# Patient Record
Sex: Female | Born: 1980 | Hispanic: Yes | Marital: Married | Smoking: Never smoker
Health system: Southern US, Community
[De-identification: ages and names within clinical notes are randomized; demographics above are authoritative.]

## PROBLEM LIST (undated history)

## (undated) ENCOUNTER — Emergency Department (HOSPITAL_COMMUNITY): Payer: Self-pay

## (undated) DIAGNOSIS — K819 Cholecystitis, unspecified: Secondary | ICD-10-CM

---

## 2017-07-05 ENCOUNTER — Emergency Department (HOSPITAL_COMMUNITY): Payer: Self-pay | Admitting: Anesthesiology

## 2017-07-05 ENCOUNTER — Emergency Department (HOSPITAL_COMMUNITY): Payer: Self-pay

## 2017-07-05 ENCOUNTER — Observation Stay (HOSPITAL_COMMUNITY)
Admission: EM | Admit: 2017-07-05 | Discharge: 2017-07-06 | Disposition: A | Discharge status: Home / Self Care | Payer: Self-pay | Attending: General Surgery | Admitting: General Surgery

## 2017-07-05 ENCOUNTER — Encounter (HOSPITAL_COMMUNITY): Payer: Self-pay | Admitting: Emergency Medicine

## 2017-07-05 ENCOUNTER — Encounter (HOSPITAL_COMMUNITY)
Admission: EM | Disposition: A | Discharge status: Home / Self Care | Payer: Self-pay | Source: Home / Self Care | Attending: Emergency Medicine

## 2017-07-05 DIAGNOSIS — Z9049 Acquired absence of other specified parts of digestive tract: Secondary | ICD-10-CM

## 2017-07-05 DIAGNOSIS — K420 Umbilical hernia with obstruction, without gangrene: Secondary | ICD-10-CM | POA: Insufficient documentation

## 2017-07-05 DIAGNOSIS — K8000 Calculus of gallbladder with acute cholecystitis without obstruction: Secondary | ICD-10-CM

## 2017-07-05 DIAGNOSIS — K819 Cholecystitis, unspecified: Secondary | ICD-10-CM | POA: Diagnosis present

## 2017-07-05 DIAGNOSIS — K8010 Calculus of gallbladder with chronic cholecystitis without obstruction: Principal | ICD-10-CM | POA: Insufficient documentation

## 2017-07-05 DIAGNOSIS — R1011 Right upper quadrant pain: Secondary | ICD-10-CM

## 2017-07-05 DIAGNOSIS — K760 Fatty (change of) liver, not elsewhere classified: Secondary | ICD-10-CM | POA: Insufficient documentation

## 2017-07-05 HISTORY — DX: Cholecystitis, unspecified: K81.9

## 2017-07-05 HISTORY — PX: UMBILICAL HERNIA REPAIR: SHX196

## 2017-07-05 HISTORY — PX: CHOLECYSTECTOMY: SHX55

## 2017-07-05 LAB — COMPREHENSIVE METABOLIC PANEL
ALBUMIN: 3.7 g/dL (ref 3.5–5.0)
ALK PHOS: 116 U/L (ref 38–126)
ALT: 23 U/L (ref 14–54)
AST: 24 U/L (ref 15–41)
Anion gap: 9 (ref 5–15)
BILIRUBIN TOTAL: 0.3 mg/dL (ref 0.3–1.2)
BUN: 16 mg/dL (ref 6–20)
CALCIUM: 9.5 mg/dL (ref 8.9–10.3)
CO2: 30 mmol/L (ref 22–32)
Chloride: 101 mmol/L (ref 101–111)
Creatinine, Ser: 0.64 mg/dL (ref 0.44–1.00)
GFR calc Af Amer: >60 mL/min (ref >60)
GFR calc non Af Amer: >60 mL/min (ref >60)
GLUCOSE: 117 mg/dL — AB (ref 65–99)
POTASSIUM: 3.8 mmol/L (ref 3.5–5.1)
SODIUM: 140 mmol/L (ref 135–145)
TOTAL PROTEIN: 7.1 g/dL (ref 6.5–8.1)

## 2017-07-05 LAB — CBC
HEMATOCRIT: 40.2 % (ref 36.0–46.0)
Hemoglobin: 12.9 g/dL (ref 12.0–15.0)
MCH: 26.1 pg (ref 26.0–34.0)
MCHC: 32.1 g/dL (ref 30.0–36.0)
MCV: 81.4 fL (ref 78.0–100.0)
Platelets: 429 10*3/uL — ABNORMAL HIGH (ref 150–400)
RBC: 4.94 MIL/uL (ref 3.87–5.11)
RDW: 14.1 % (ref 11.5–15.5)
WBC: 13.7 10*3/uL — ABNORMAL HIGH (ref 4.0–10.5)

## 2017-07-05 LAB — URINALYSIS, ROUTINE W REFLEX MICROSCOPIC
BILIRUBIN URINE: NEGATIVE
GLUCOSE, UA: NEGATIVE mg/dL
HGB URINE DIPSTICK: NEGATIVE
KETONES UR: NEGATIVE mg/dL
Leukocytes, UA: NEGATIVE
NITRITE: NEGATIVE
PH: 7 (ref 5.0–8.0)
Protein, ur: NEGATIVE mg/dL
SPECIFIC GRAVITY, URINE: 1.025 (ref 1.005–1.030)

## 2017-07-05 LAB — I-STAT BETA HCG BLOOD, ED (MC, WL, AP ONLY): I-stat hCG, quantitative: <5 m[IU]/mL (ref <5)

## 2017-07-05 LAB — LIPASE, BLOOD: Lipase: 44 U/L (ref 11–51)

## 2017-07-05 SURGERY — LAPAROSCOPIC CHOLECYSTECTOMY
Anesthesia: General | Site: Abdomen

## 2017-07-05 MED ORDER — LIDOCAINE HCL (CARDIAC) 20 MG/ML IV SOLN
INTRAVENOUS | Status: DC | PRN
  Administered 2017-07-05: 60 mg via INTRAVENOUS

## 2017-07-05 MED ORDER — SODIUM CHLORIDE 0.9 % IV SOLN
INTRAVENOUS | Status: DC
  Administered 2017-07-05: 17:00:00 via INTRAVENOUS

## 2017-07-05 MED ORDER — ONDANSETRON HCL 4 MG/2ML IJ SOLN
4.0000 mg | Freq: Four times a day (QID) | INTRAMUSCULAR | Status: DC | PRN
  Administered 2017-07-05 – 2017-07-06 (×2): 4 mg via INTRAVENOUS
  Filled 2017-07-05 (×2): qty 2

## 2017-07-05 MED ORDER — BUPIVACAINE-EPINEPHRINE 0.25% -1:200000 IJ SOLN
INTRAMUSCULAR | Status: DC | PRN
  Administered 2017-07-05: 1 mL

## 2017-07-05 MED ORDER — ROCURONIUM BROMIDE 10 MG/ML (PF) SYRINGE
PREFILLED_SYRINGE | INTRAVENOUS | Status: AC
  Filled 2017-07-05: qty 10

## 2017-07-05 MED ORDER — MORPHINE SULFATE (PF) 4 MG/ML IV SOLN
2.0000 mg | INTRAVENOUS | Status: DC | PRN
  Filled 2017-07-05: qty 1

## 2017-07-05 MED ORDER — GELATIN ABSORBABLE 12-7 MM EX MISC
CUTANEOUS | Status: DC | PRN
  Administered 2017-07-05: 1

## 2017-07-05 MED ORDER — FENTANYL CITRATE (PF) 100 MCG/2ML IJ SOLN
INTRAMUSCULAR | Status: DC | PRN
  Administered 2017-07-05: 50 ug via INTRAVENOUS
  Administered 2017-07-05: 150 ug via INTRAVENOUS
  Administered 2017-07-05: 50 ug via INTRAVENOUS

## 2017-07-05 MED ORDER — BUPIVACAINE-EPINEPHRINE (PF) 0.25% -1:200000 IJ SOLN
INTRAMUSCULAR | Status: AC
  Filled 2017-07-05: qty 30

## 2017-07-05 MED ORDER — OXYCODONE HCL 5 MG PO TABS
5.0000 mg | ORAL_TABLET | ORAL | Status: DC | PRN
  Administered 2017-07-05: 10 mg via ORAL
  Administered 2017-07-06: 5 mg via ORAL
  Filled 2017-07-05: qty 1
  Filled 2017-07-05: qty 2

## 2017-07-05 MED ORDER — IBUPROFEN 400 MG PO TABS
400.0000 mg | ORAL_TABLET | Freq: Once | ORAL | Status: AC | PRN
  Administered 2017-07-05: 400 mg via ORAL

## 2017-07-05 MED ORDER — LACTATED RINGERS IV SOLN
INTRAVENOUS | Status: DC
  Administered 2017-07-05: 14:00:00 via INTRAVENOUS

## 2017-07-05 MED ORDER — PHENYLEPHRINE 40 MCG/ML (10ML) SYRINGE FOR IV PUSH (FOR BLOOD PRESSURE SUPPORT)
PREFILLED_SYRINGE | INTRAVENOUS | Status: AC
  Filled 2017-07-05: qty 20

## 2017-07-05 MED ORDER — FENTANYL CITRATE (PF) 250 MCG/5ML IJ SOLN
INTRAMUSCULAR | Status: AC
  Filled 2017-07-05: qty 5

## 2017-07-05 MED ORDER — ONDANSETRON HCL 4 MG/2ML IJ SOLN
INTRAMUSCULAR | Status: DC | PRN
  Administered 2017-07-05: 4 mg via INTRAVENOUS

## 2017-07-05 MED ORDER — ONDANSETRON 4 MG PO TBDP: 4.0000 mg | ORAL_TABLET | Freq: Four times a day (QID) | ORAL | Status: DC | PRN

## 2017-07-05 MED ORDER — ROCURONIUM BROMIDE 100 MG/10ML IV SOLN
INTRAVENOUS | Status: DC | PRN
  Administered 2017-07-05: 30 mg via INTRAVENOUS
  Administered 2017-07-05: 10 mg via INTRAVENOUS

## 2017-07-05 MED ORDER — DEXAMETHASONE SODIUM PHOSPHATE 10 MG/ML IJ SOLN
INTRAMUSCULAR | Status: DC | PRN
  Administered 2017-07-05: 5 mg via INTRAVENOUS

## 2017-07-05 MED ORDER — SODIUM CHLORIDE 0.9 % IV BOLUS (SEPSIS)
1000.0000 mL | Freq: Once | INTRAVENOUS | Status: AC
  Administered 2017-07-05: 1000 mL via INTRAVENOUS

## 2017-07-05 MED ORDER — SIMETHICONE 80 MG PO CHEW: 40.0000 mg | CHEWABLE_TABLET | Freq: Four times a day (QID) | ORAL | Status: DC | PRN

## 2017-07-05 MED ORDER — IBUPROFEN 400 MG PO TABS
ORAL_TABLET | ORAL | Status: AC
Start: 2017-07-05 — End: 2017-07-05
  Filled 2017-07-05: qty 1

## 2017-07-05 MED ORDER — DEXTROSE 5 % IV SOLN
2.0000 g | INTRAVENOUS | Status: DC
  Filled 2017-07-05: qty 2

## 2017-07-05 MED ORDER — ENOXAPARIN SODIUM 40 MG/0.4ML ~~LOC~~ SOLN
40.0000 mg | SUBCUTANEOUS | Status: DC
  Administered 2017-07-06: 40 mg via SUBCUTANEOUS
  Filled 2017-07-05: qty 0.4

## 2017-07-05 MED ORDER — PROPOFOL 10 MG/ML IV BOLUS
INTRAVENOUS | Status: DC | PRN
  Administered 2017-07-05: 20 mg via INTRAVENOUS
  Administered 2017-07-05: 140 mg via INTRAVENOUS

## 2017-07-05 MED ORDER — DEXAMETHASONE SODIUM PHOSPHATE 10 MG/ML IJ SOLN
INTRAMUSCULAR | Status: AC
  Filled 2017-07-05: qty 3

## 2017-07-05 MED ORDER — ONDANSETRON 4 MG PO TBDP
4.0000 mg | ORAL_TABLET | Freq: Once | ORAL | Status: AC | PRN
  Administered 2017-07-05: 4 mg via ORAL

## 2017-07-05 MED ORDER — GI COCKTAIL ~~LOC~~
30.0000 mL | Freq: Once | ORAL | Status: AC
  Administered 2017-07-05: 30 mL via ORAL
  Filled 2017-07-05: qty 30

## 2017-07-05 MED ORDER — KETOROLAC TROMETHAMINE 15 MG/ML IJ SOLN
15.0000 mg | Freq: Four times a day (QID) | INTRAMUSCULAR | Status: DC
  Administered 2017-07-05 – 2017-07-06 (×3): 15 mg via INTRAVENOUS
  Filled 2017-07-05 (×3): qty 1

## 2017-07-05 MED ORDER — MORPHINE SULFATE (PF) 4 MG/ML IV SOLN
4.0000 mg | Freq: Once | INTRAVENOUS | Status: AC
Start: 2017-07-05 — End: 2017-07-05
  Administered 2017-07-05: 4 mg via INTRAVENOUS
  Filled 2017-07-05: qty 1

## 2017-07-05 MED ORDER — ETOMIDATE 2 MG/ML IV SOLN
INTRAVENOUS | Status: AC
  Filled 2017-07-05: qty 10

## 2017-07-05 MED ORDER — LIDOCAINE 2% (20 MG/ML) 5 ML SYRINGE
INTRAMUSCULAR | Status: AC
  Filled 2017-07-05: qty 10

## 2017-07-05 MED ORDER — ONDANSETRON HCL 4 MG/2ML IJ SOLN
4.0000 mg | Freq: Once | INTRAMUSCULAR | Status: AC
  Administered 2017-07-05: 4 mg via INTRAVENOUS
  Filled 2017-07-05: qty 2

## 2017-07-05 MED ORDER — HYDROMORPHONE HCL 1 MG/ML IJ SOLN: 0.2500 mg | INTRAMUSCULAR | Status: DC | PRN

## 2017-07-05 MED ORDER — MIDAZOLAM HCL 5 MG/5ML IJ SOLN
INTRAMUSCULAR | Status: DC | PRN
  Administered 2017-07-05: 2 mg via INTRAVENOUS

## 2017-07-05 MED ORDER — SODIUM CHLORIDE 0.9 % IR SOLN
Status: DC | PRN
  Administered 2017-07-05: 1000 mL

## 2017-07-05 MED ORDER — SUCCINYLCHOLINE CHLORIDE 200 MG/10ML IV SOSY
PREFILLED_SYRINGE | INTRAVENOUS | Status: AC
  Filled 2017-07-05: qty 20

## 2017-07-05 MED ORDER — DEXTROSE 5 % IV SOLN
2.0000 g | INTRAVENOUS | Status: DC
  Administered 2017-07-05: 2 g via INTRAVENOUS
  Filled 2017-07-05: qty 2

## 2017-07-05 MED ORDER — SUGAMMADEX SODIUM 200 MG/2ML IV SOLN
INTRAVENOUS | Status: DC | PRN
  Administered 2017-07-05: 100 mg via INTRAVENOUS

## 2017-07-05 MED ORDER — ONDANSETRON HCL 4 MG/2ML IJ SOLN
INTRAMUSCULAR | Status: AC
  Filled 2017-07-05: qty 4

## 2017-07-05 MED ORDER — ACETAMINOPHEN 500 MG PO TABS
1000.0000 mg | ORAL_TABLET | Freq: Four times a day (QID) | ORAL | Status: DC
  Administered 2017-07-06 (×2): 1000 mg via ORAL
  Filled 2017-07-05 (×3): qty 2

## 2017-07-05 MED ORDER — 0.9 % SODIUM CHLORIDE (POUR BTL) OPTIME
TOPICAL | Status: DC | PRN
  Administered 2017-07-05: 1000 mL

## 2017-07-05 MED ORDER — ONDANSETRON 4 MG PO TBDP
ORAL_TABLET | ORAL | Status: AC
Start: 2017-07-05 — End: 2017-07-05
  Filled 2017-07-05: qty 1

## 2017-07-05 MED ORDER — SUCCINYLCHOLINE CHLORIDE 20 MG/ML IJ SOLN
INTRAMUSCULAR | Status: DC | PRN
  Administered 2017-07-05: 100 mg via INTRAVENOUS

## 2017-07-05 MED ORDER — MIDAZOLAM HCL 2 MG/2ML IJ SOLN
INTRAMUSCULAR | Status: AC
  Filled 2017-07-05: qty 2

## 2017-07-05 SURGICAL SUPPLY — 39 items
APPLIER CLIP 5 13 M/L LIGAMAX5 (MISCELLANEOUS) ×3
BLADE CLIPPER SURG (BLADE) IMPLANT
CANISTER SUCT 3000ML PPV (MISCELLANEOUS) ×3 IMPLANT
CHLORAPREP W/TINT 26ML (MISCELLANEOUS) ×3 IMPLANT
CLIP APPLIE 5 13 M/L LIGAMAX5 (MISCELLANEOUS) ×1 IMPLANT
CLOSURE WOUND 1/2 X4 (GAUZE/BANDAGES/DRESSINGS) ×1
COVER SURGICAL LIGHT HANDLE (MISCELLANEOUS) ×3 IMPLANT
DERMABOND ADVANCED (GAUZE/BANDAGES/DRESSINGS) ×2
DERMABOND ADVANCED .7 DNX12 (GAUZE/BANDAGES/DRESSINGS) ×1 IMPLANT
DEVICE TROCAR PUNCTURE CLOSURE (ENDOMECHANICALS) ×3 IMPLANT
ELECT REM PT RETURN 9FT ADLT (ELECTROSURGICAL) ×3
ELECTRODE REM PT RTRN 9FT ADLT (ELECTROSURGICAL) ×1 IMPLANT
GLOVE BIO SURGEON STRL SZ7 (GLOVE) ×3 IMPLANT
GLOVE BIOGEL PI IND STRL 7.5 (GLOVE) ×1 IMPLANT
GLOVE BIOGEL PI INDICATOR 7.5 (GLOVE) ×2
GOWN STRL REUS W/ TWL LRG LVL3 (GOWN DISPOSABLE) ×3 IMPLANT
GOWN STRL REUS W/TWL LRG LVL3 (GOWN DISPOSABLE) ×6
HEMOSTAT SNOW SURGICEL 2X4 (HEMOSTASIS) ×3 IMPLANT
KIT BASIN OR (CUSTOM PROCEDURE TRAY) ×3 IMPLANT
KIT ROOM TURNOVER OR (KITS) ×3 IMPLANT
NS IRRIG 1000ML POUR BTL (IV SOLUTION) ×3 IMPLANT
PAD ARMBOARD 7.5X6 YLW CONV (MISCELLANEOUS) ×3 IMPLANT
POUCH RETRIEVAL ECOSAC 10 (ENDOMECHANICALS) ×1 IMPLANT
POUCH RETRIEVAL ECOSAC 10MM (ENDOMECHANICALS) ×2
SCISSORS LAP 5X35 DISP (ENDOMECHANICALS) ×3 IMPLANT
SET IRRIG TUBING LAPAROSCOPIC (IRRIGATION / IRRIGATOR) ×3 IMPLANT
SLEEVE ENDOPATH XCEL 5M (ENDOMECHANICALS) ×6 IMPLANT
SPECIMEN JAR SMALL (MISCELLANEOUS) ×3 IMPLANT
STRIP CLOSURE SKIN 1/2X4 (GAUZE/BANDAGES/DRESSINGS) ×2 IMPLANT
SUT MNCRL AB 4-0 PS2 18 (SUTURE) ×3 IMPLANT
SUT VIC AB 3-0 SH 27 (SUTURE) ×2
SUT VIC AB 3-0 SH 27XBRD (SUTURE) ×1 IMPLANT
SUT VICRYL 0 UR6 27IN ABS (SUTURE) ×3 IMPLANT
TOWEL OR 17X24 6PK STRL BLUE (TOWEL DISPOSABLE) ×3 IMPLANT
TOWEL OR 17X26 10 PK STRL BLUE (TOWEL DISPOSABLE) ×3 IMPLANT
TRAY LAPAROSCOPIC MC (CUSTOM PROCEDURE TRAY) ×3 IMPLANT
TROCAR XCEL BLUNT TIP 100MML (ENDOMECHANICALS) ×3 IMPLANT
TROCAR XCEL NON-BLD 5MMX100MML (ENDOMECHANICALS) ×3 IMPLANT
TUBING INSUFFLATION (TUBING) ×3 IMPLANT

## 2017-07-05 NOTE — Anesthesia Preprocedure Evaluation (Addendum)
Anesthesia Evaluation  Patient identified by MRN, date of birth, ID band Patient awake    Reviewed: Allergy & Precautions, H&P , NPO status , Patient's Chart, lab work & pertinent test results  Airway Mallampati: III  TM Distance: >3 FB Neck ROM: Full    Dental no notable dental hx. (+) Teeth Intact, Dental Advisory Given   Pulmonary neg pulmonary ROS,    Pulmonary exam normal breath sounds clear to auscultation       Cardiovascular negative cardio ROS   Rhythm:Regular Rate:Normal     Neuro/Psych negative neurological ROS  negative psych ROS   GI/Hepatic negative GI ROS, Neg liver ROS,   Endo/Other  negative endocrine ROS  Renal/GU negative Renal ROS  negative genitourinary   Musculoskeletal   Abdominal   Peds  Hematology negative hematology ROS (+)   Anesthesia Other Findings   Reproductive/Obstetrics negative OB ROS                            Anesthesia Physical Anesthesia Plan  ASA: II  Anesthesia Plan: General   Post-op Pain Management:    Induction: Intravenous, Rapid sequence and Cricoid pressure planned  PONV Risk Score and Plan: 4 or greater and Ondansetron, Dexamethasone and Midazolam  Airway Management Planned: Oral ETT  Additional Equipment:   Intra-op Plan:   Post-operative Plan: Extubation in OR  Informed Consent: I have reviewed the patients History and Physical, chart, labs and discussed the procedure including the risks, benefits and alternatives for the proposed anesthesia with the patient or authorized representative who has indicated his/her understanding and acceptance.   Dental advisory given  Plan Discussed with: CRNA and Surgeon  Anesthesia Plan Comments:        Anesthesia Quick Evaluation

## 2017-07-05 NOTE — ED Notes (Signed)
ED Provider at bedside. 

## 2017-07-05 NOTE — Op Note (Signed)
Preoperative diagnosis: acutecholecystitis Postoperative diagnosis:acutecholecystitis Procedure: Laparoscopic cholecystectomy, primary umbilical hernia repair Surgeon: Dr. Harden Mo Anesthesia: Gen. Specimens: gb to pathology Estimated blood loss: minimal Complications: None Drains: none Sponge count was correct at completion Disposition to recovery stable  Indications: This is a 45 yof with symptoms consistent with cholecystitis. We discussed lap chole with risks/benefits.  Procedure: After informed consent was obtained the patient was taken to the operating room. She was givenantibiotics. SCDs were in place. She was placed undergeneral anesthesia without complication. Her abdomen was prepped and draped in the standard sterile surgical fashion. A surgical timeout was then performed.  I infiltrated marcaine below the umbilicus. I made a vertical incision. I lifted the umbilicus off the fascia and identified a 3 mm defect but had a lot of preperitoneal fat incarcerated in it.  I was able to enter the sac and excise it in its entirey.  I incised the fascia and entered the peritoneum through the enlarged hernai. I placed a 0 vicryl pursestring suture and inserted a hasson trocar. I insufflated the abdomen to 15 mm Hg pressure. I then placed a 5 mm trocar in theepigastrium.Two 5 mm trocars were placed in the right side of the abdomen. I grasped the gallbladder and retracted cephalad and lateral.  EventuallyI was able to identify the critical view of safety. I then clipped the duct and divided it. The duct was viable. The clips traversed the duct. I then clipped and divided the cystic artery. I then removed the gallbladder from the liver bed. I placed it in a bag and removed from the umbilicus .I obtained hemostasis.I did place a piece of surgicel snow in the gallbladder bed. I then removed the 45mm trocar and closed the umbilical hernia where I had the trocar with multiple2-0  vicryls using the endoclose device.This completely obliterated the defect. I then removed the remaining trocars and these were closed with 4-0 Monocryl and glue. She tolerated this well be transferred to the recovery room.

## 2017-07-05 NOTE — Transfer of Care (Signed)
Immediate Anesthesia Transfer of Care Note  Patient: Kaitlyn Brooks  Procedure(s) Performed: Procedure(s): LAPAROSCOPIC CHOLECYSTECTOMY (N/A) UMBILICAL HERNIA REPAIR (N/A)  Patient Location: PACU  Anesthesia Type:General  Level of Consciousness: drowsy  Airway & Oxygen Therapy: Patient Spontanous Breathing and Patient connected to face mask oxygen  Post-op Assessment: Report given to RN, Post -op Vital signs reviewed and stable and Patient moving all extremities  Post vital signs: Reviewed and stable  Last Vitals:  Vitals:   07/05/17 1300 07/05/17 1553  BP:  109/61  Pulse: 76 79  Resp:  13  Temp:  (!) 36.3 C  SpO2: 97% 96%    Last Pain:  Vitals:   07/05/17 1553  TempSrc:   PainSc: (P) Asleep         Complications: No apparent anesthesia complications

## 2017-07-05 NOTE — Progress Notes (Signed)
Patient arrived to 6n04, alert and oriented , minimal pain to abdomen, VSS, IV fluids infusing, SCD's on, 4 lap sites noted to mid abdomen with steri-strips all clean dry and intact. Oriented to room and staff, family in room, will continue to monitor.

## 2017-07-05 NOTE — ED Triage Notes (Signed)
Pt c/o 9/10 epigastric pain since last night with nausea and vomiting no fever or chills.

## 2017-07-05 NOTE — H&P (Signed)
[facility] Surgery Consult/Admission Note  Kaitlyn Brooks 23-Feb-1981  [id number].    Requesting MD: Dr. Tyrone Nine Chief Complaint/Reason for Consult: cholecystits  HPI:   Patient is a 36 year old Spanish-speaking female, otherwise healthy, history of cesarean section, who presented to the ED with complaint of abdominal pain since yesterday. Family member at bedside acting as interpreter. Patient states she's had intermittent abdominal pain for "a while" with associated vomiting. She was seen in 2016 for similar symptoms but surgery was not discussed at that time. Patient states the pain is in the epigastric/RUQ quadrant that radiates into her back, severe, constant, pain medication given in ED has helped. Associated nausea, vomiting, chills, and dizziness without LOC. No other associated symptoms. Patient denies diarrhea. Last normal bowel movement was yesterday. Patient denies chest pain, SOB, fever. Last meal was yesterday. No anticoagulation. No other abdominal surgeries.   ED course: Labs: MVC 13.7, glucose 117 [place] abd: cholelithiasis and sludge without sonographic evidence of acute cholecystitis. Hepatic steatosis.  ROS:  Review of Systems  Constitutional: Positive for chills. Negative for diaphoresis and fever.  Respiratory: Negative for cough and shortness of breath.   Cardiovascular: Negative for chest pain.  Gastrointestinal: Positive for abdominal pain, nausea and vomiting. Negative for constipation and diarrhea.  Neurological: Positive for dizziness. Negative for loss of consciousness and weakness.  All other systems reviewed and are negative.    History reviewed. No pertinent family history.  Past Medical History:  Diagnosis Date  . Cholecystitis     History reviewed. No pertinent surgical history.  Social History:  reports that she has never smoked. She has never used smokeless tobacco. She reports that she does not drink alcohol or use drugs.  Allergies: No  Known Allergies   (Not in a hospital admission)  Blood pressure (!) 107/57, pulse 71, temperature 98.1 F (36.7 C), temperature source Oral, resp. rate 20, height 5' 4"  (1.626 m), weight 200 lb (90.7 kg), last menstrual period 06/26/2017, SpO2 94 %.  Physical Exam  Constitutional: She is oriented to person, place, and time and well-developed, well-nourished, and in no distress. Vital signs are normal. No distress.  HENT:  Head: Normocephalic and atraumatic.  Nose: Nose normal.  Mouth/Throat: Oropharynx is clear and moist. No oropharyngeal exudate.  Eyes: Pupils are equal, round, and reactive to light. Conjunctivae are normal. Right eye exhibits no discharge. Left eye exhibits no discharge. No scleral icterus.  Neck: Normal range of motion. Neck supple. No tracheal deviation present. No thyromegaly present.  Cardiovascular: Normal rate, regular rhythm, normal heart sounds and intact distal pulses.  Exam reveals no gallop and no friction rub.   No murmur heard. Pulses:      Radial pulses are 2+ on the right side, and 2+ on the left side.       Dorsalis pedis pulses are 2+ on the right side, and 2+ on the left side.  Pulmonary/Chest: Effort normal and breath sounds normal. No respiratory distress. She has no decreased breath sounds. She has no wheezes. She has no rhonchi. She has no rales.  Abdominal: Soft. Normal appearance and bowel sounds are normal. She exhibits no distension and no mass. There is no hepatosplenomegaly. There is tenderness in the right upper quadrant and epigastric area. There is no rebound and no guarding. No hernia.  Musculoskeletal: Normal range of motion. She exhibits no edema, tenderness or deformity.  Lymphadenopathy:    She has no cervical adenopathy.  Neurological: She is alert and oriented to person, place, and  time. No cranial nerve deficit (grossly intact).  Skin: Skin is warm and dry. No rash noted. She is not diaphoretic.  Psychiatric: Mood and affect  normal.  Nursing note and vitals reviewed.   Results for orders placed or performed during the hospital encounter of 07/05/17 (from the past 48 hour(s))  Urinalysis, Routine w reflex microscopic     Status: Abnormal   Collection Time: 07/05/17  3:36 AM  Result Value Ref Range   Color, Urine YELLOW YELLOW   APPearance CLOUDY (A) CLEAR   Specific Gravity, Urine 1.025 1.005 - 1.030   pH 7.0 5.0 - 8.0   Glucose, UA NEGATIVE NEGATIVE mg/dL   Hgb urine dipstick NEGATIVE NEGATIVE   Bilirubin Urine NEGATIVE NEGATIVE   Ketones, ur NEGATIVE NEGATIVE mg/dL   Protein, ur NEGATIVE NEGATIVE mg/dL   Nitrite NEGATIVE NEGATIVE   Leukocytes, UA NEGATIVE NEGATIVE  Lipase, blood     Status: None   Collection Time: 07/05/17  3:51 AM  Result Value Ref Range   Lipase 44 11 - 51 U/L  Comprehensive metabolic panel     Status: Abnormal   Collection Time: 07/05/17  3:51 AM  Result Value Ref Range   Sodium 140 135 - 145 mmol/L   Potassium 3.8 3.5 - 5.1 mmol/L   Chloride 101 101 - 111 mmol/L   CO2 30 22 - 32 mmol/L   Glucose, Bld 117 (H) 65 - 99 mg/dL   BUN 16 6 - 20 mg/dL   Creatinine, Ser 0.64 0.44 - 1.00 mg/dL   Calcium 9.5 8.9 - 10.3 mg/dL   Total Protein 7.1 6.5 - 8.1 g/dL   Albumin 3.7 3.5 - 5.0 g/dL   AST 24 15 - 41 U/L   ALT 23 14 - 54 U/L   Alkaline Phosphatase 116 38 - 126 U/L   Total Bilirubin 0.3 0.3 - 1.2 mg/dL   GFR calc non Af Amer >60 >60 mL/min   GFR calc Af Amer >60 >60 mL/min    Comment: (NOTE) The eGFR has been calculated using the CKD EPI equation. This calculation has not been validated in all clinical situations. eGFR's persistently <60 mL/min signify possible Chronic Kidney Disease.    Anion gap 9 5 - 15  CBC     Status: Abnormal   Collection Time: 07/05/17  3:51 AM  Result Value Ref Range   WBC 13.7 (H) 4.0 - 10.5 K/uL   RBC 4.94 3.87 - 5.11 MIL/uL   Hemoglobin 12.9 12.0 - 15.0 g/dL   HCT 40.2 36.0 - 46.0 %   MCV 81.4 78.0 - 100.0 fL   MCH 26.1 26.0 - 34.0 pg    MCHC 32.1 30.0 - 36.0 g/dL   RDW 14.1 11.5 - 15.5 %   Platelets 429 (H) 150 - 400 K/uL  I-Stat beta hCG blood, ED     Status: None   Collection Time: 07/05/17  4:11 AM  Result Value Ref Range   I-stat hCG, quantitative <5.0 <5 mIU/mL   Comment 3            Comment:   GEST. AGE      CONC.  (mIU/mL)   <=1 WEEK        5 - 50     2 WEEKS       50 - 500     3 WEEKS       100 - 10,000     4 WEEKS     1,000 - 30,000  FEMALE AND NON-PREGNANT FEMALE:     LESS THAN 5 mIU/mL    US Abdomen Limited Ruq  Result Date: 07/05/2017 CLINICAL DATA:  Right upper quadrant abdominal pain. EXAM: ULTRASOUND ABDOMEN LIMITED RIGHT UPPER QUADRANT COMPARISON:  None. FINDINGS: Gallbladder: Multiple gallstones and sludge seen within the gallbladder. No wall thickening or pericholecystic fluid. No sonographic Murphy sign noted by sonographer. Common bile duct: Diameter: 3 mm, normal. Liver: No focal lesion identified. Increased parenchymal echogenicity. Portal vein is patent on color Doppler imaging with normal direction of blood flow towards the liver. IMPRESSION: 1. Cholelithiasis and sludge without sonographic evidence of acute cholecystitis. 2. Hepatic steatosis. Electronically Signed   By: Titus Dubin M.D.   On: 07/05/2017 11:21      Assessment/Plan  Cholecystitis - OR today for cholecystectomy - NPO  Admit to [facility] after surgery.   Kalman Drape, [facility] Surgery 07/05/2017, 1:21 PM Pager: [phone] Consults: [phone] Mon-Fri 7:00 am-4:30 pm Sat-Sun 7:00 am-11:30 am

## 2017-07-05 NOTE — ED Provider Notes (Signed)
[facility] Provider Note   CSN: [id number] Arrival date & time: 07/05/17  0214     History   Chief Complaint Chief Complaint  Patient presents with  . Abdominal Pain  . Emesis    HPI Kaitlyn Brooks is a 36 y.o. female.  50 yoF with a chief complaints of epigastric abdominal pain nausea and vomiting. The started yesterday and is gotten progressively worse since then. She is unable to keep anything down. Denies fevers or chills. Described as a sharp and shooting pain. Feels that radiates to her back. Denies prior abdominal surgery.   The history is provided by the patient and a relative. A language interpreter was used.  Abdominal Pain   This is a new problem. The current episode started yesterday. The problem occurs constantly. The problem has been gradually worsening. The pain is associated with eating. The pain is located in the RUQ. The quality of the pain is sharp and shooting. The pain is at a severity of 10/10. The pain is severe. Associated symptoms include nausea and vomiting. Pertinent negatives include fever, dysuria, headaches, arthralgias and myalgias. Nothing aggravates the symptoms. Nothing relieves the symptoms.    Past Medical History:  Diagnosis Date  . Cholecystitis   . Cholecystitis 07/05/2017    Patient Active Problem List   Diagnosis Date Noted  . Cholecystitis 07/05/2017  . S/P laparoscopic cholecystectomy 07/05/2017    Past Surgical History:  Procedure Laterality Date  . CHOLECYSTECTOMY N/A 07/05/2017   Procedure: LAPAROSCOPIC CHOLECYSTECTOMY;  Surgeon: Emelia Loron, MD;  Location: [facility] OR;  Service: General;  Laterality: N/A;  . UMBILICAL HERNIA REPAIR N/A 07/05/2017   Procedure: UMBILICAL HERNIA REPAIR;  Surgeon: Emelia Loron, MD;  Location: [facility] OR;  Service: General;  Laterality: N/A;    OB History    No data available       Home Medications    Prior to Admission medications   Medication Sig Start Date End Date  Taking? Authorizing Provider  acetaminophen (TYLENOL) 500 MG tablet Take 1,000 mg by mouth every 6 (six) hours as needed for moderate pain.   Yes [provider]  Multiple Vitamin (MULTIVITAMIN WITH MINERALS) TABS tablet Take 1 tablet by mouth daily.   Yes [provider]  oxyCODONE (OXY IR/ROXICODONE) 5 MG immediate release tablet Take 1-2 tablets (5-10 mg total) by mouth every 4 (four) hours as needed for moderate pain. 07/06/17   Claud Kelp, MD    Family History History reviewed. No pertinent family history.  Social History Social History  Substance Use Topics  . Smoking status: Never Smoker  . Smokeless tobacco: Never Used  . Alcohol use No     Allergies   Patient has no known allergies.   Review of Systems Review of Systems  Constitutional: Negative for chills and fever.  HENT: Negative for congestion and rhinorrhea.   Eyes: Negative for redness and visual disturbance.  Respiratory: Negative for shortness of breath and wheezing.   Cardiovascular: Negative for chest pain and palpitations.  Gastrointestinal: Positive for abdominal pain, nausea and vomiting.  Genitourinary: Negative for dysuria and urgency.  Musculoskeletal: Negative for arthralgias and myalgias.  Skin: Negative for pallor and wound.  Neurological: Negative for dizziness and headaches.     Physical Exam Updated Vital Signs BP 113/63 (BP Location: Right Arm)   Pulse 90   Temp 98.5 F (36.9 C) (Oral)   Resp 17   Ht 5\' 1"  (1.549 m)   Wt 90.7 kg (200 lb)  LMP 06/26/2017   SpO2 94%   BMI 37.79 kg/m   Physical Exam  Constitutional: She is oriented to person, place, and time. She appears well-developed and well-nourished. No distress.  HENT:  Head: Normocephalic and atraumatic.  Eyes: Pupils are equal, round, and reactive to light. EOM are normal.  Neck: Normal range of motion. Neck supple.  Cardiovascular: Normal rate and regular rhythm.  Exam reveals no gallop and no  friction rub.   No murmur heard. Pulmonary/Chest: Effort normal. She has no wheezes. She has no rales.  Abdominal: Soft. She exhibits no distension and no mass. There is tenderness (worse to the epigastrium and right upper quadrant. Positive Murphy sign.). There is no guarding.  Musculoskeletal: She exhibits no edema or tenderness.  Neurological: She is alert and oriented to person, place, and time.  Skin: Skin is warm and dry. She is not diaphoretic.  Psychiatric: She has a normal mood and affect. Her behavior is normal.  Nursing note and vitals reviewed.    ED Treatments / Results  Labs (all labs ordered are listed, but only abnormal results are displayed) Labs Reviewed  COMPREHENSIVE METABOLIC PANEL - Abnormal; Notable for the following:       Result Value   Glucose, Bld 117 (*)    All other components within normal limits  CBC - Abnormal; Notable for the following:    WBC 13.7 (*)    Platelets 429 (*)    All other components within normal limits  URINALYSIS, ROUTINE W REFLEX MICROSCOPIC - Abnormal; Notable for the following:    APPearance CLOUDY (*)    All other components within normal limits  LIPASE, BLOOD  I-STAT BETA HCG BLOOD, ED ([facility], [facility], [facility] ONLY)  SURGICAL PATHOLOGY    EKG  EKG Interpretation None       Radiology No results found.  Procedures Procedures (including critical care time)  Medications Ordered in ED Medications  ibuprofen (ADVIL,MOTRIN) 400 MG tablet (not administered)  ondansetron (ZOFRAN-ODT) 4 MG disintegrating tablet (not administered)  ondansetron (ZOFRAN-ODT) disintegrating tablet 4 mg (4 mg Oral Given 07/05/17 0357)  ibuprofen (ADVIL,MOTRIN) tablet 400 mg (400 mg Oral Given 07/05/17 0357)  gi cocktail (Maalox,Lidocaine,Donnatal) (30 mLs Oral Given 07/05/17 0926)  ondansetron (ZOFRAN) injection 4 mg (4 mg Intravenous Given 07/05/17 0928)  sodium chloride 0.9 % bolus 1,000 mL (1,000 mLs Intravenous New Bag/Given 07/05/17 0928)  morphine 4  MG/ML injection 4 mg (4 mg Intravenous Given 07/05/17 1017)     Initial Impression / Assessment and Plan / ED Course  I have reviewed the triage vital signs and the nursing notes.  Pertinent labs & imaging results that were available during my care of the patient were reviewed by me and considered in my medical decision making (see chart for details).     36 yo F With a chief complaint of epigastric abdominal pain. Patient does have significant right upper quadrant tenderness and a positive Murphy sign. We'll send for ultrasound. Labs with a leukocytosis, LFTs are unremarkable.  Pain is significantly improved though patient still feels that something is there. Still exquisitely tender in the right upper quadrant on my exam. Ultrasound with cholelithiasis without cholecystitis. With her still having some severe pain in the right upper quadrant and symptoms lasting much longer than 8 hours and concern that this may be cholecystitis. Will discuss with Surgery.  Will take to the OR.   The patients results and plan were reviewed and discussed.   Any x-rays performed were independently  reviewed by myself.   Differential diagnosis were considered with the presenting HPI.  Medications  ibuprofen (ADVIL,MOTRIN) 400 MG tablet (not administered)  ondansetron (ZOFRAN-ODT) 4 MG disintegrating tablet (not administered)  ondansetron (ZOFRAN-ODT) disintegrating tablet 4 mg (4 mg Oral Given 07/05/17 0357)  ibuprofen (ADVIL,MOTRIN) tablet 400 mg (400 mg Oral Given 07/05/17 0357)  gi cocktail (Maalox,Lidocaine,Donnatal) (30 mLs Oral Given 07/05/17 0926)  ondansetron (ZOFRAN) injection 4 mg (4 mg Intravenous Given 07/05/17 0928)  sodium chloride 0.9 % bolus 1,000 mL (1,000 mLs Intravenous New Bag/Given 07/05/17 0928)  morphine 4 MG/ML injection 4 mg (4 mg Intravenous Given 07/05/17 1017)    Vitals:   07/05/17 1725 07/05/17 2119 07/06/17 0227 07/06/17 0606  BP: 116/71 107/66 112/64 113/63  Pulse: 99 85 80 90   Resp: 14 17 17 17   Temp: 99 F (37.2 C) 98.6 F (37 C) 98.6 F (37 C) 98.5 F (36.9 C)  TempSrc: Oral Oral Oral Oral  SpO2: 90% 93% 94% 94%  Weight: 90.7 kg (200 lb)     Height: 5\' 1"  (1.549 m)       Final diagnoses:  RUQ abdominal pain  Calculus of gallbladder with acute cholecystitis without obstruction    Admission/ observation were discussed with the admitting physician, patient and/or family and they are comfortable with the plan.    Final Clinical Impressions(s) / ED Diagnoses   Final diagnoses:  RUQ abdominal pain  Calculus of gallbladder with acute cholecystitis without obstruction    New Prescriptions Discharge Medication List as of 07/06/2017 10:53 AM    START taking these medications   Details  oxyCODONE (OXY IR/ROXICODONE) 5 MG immediate release tablet Take 1-2 tablets (5-10 mg total) by mouth every 4 (four) hours as needed for moderate pain., Starting Sat 07/06/2017, Print         Speers, Jesusita Oka, DO 07/08/17 [phone]

## 2017-07-05 NOTE — Anesthesia Procedure Notes (Signed)
Procedure Name: Intubation Date/Time: 07/05/2017 2:24 PM Performed by: Melina Copa, Mariangela Heldt R Pre-anesthesia Checklist: Patient identified, Emergency Drugs available, Suction available and Patient being monitored Patient Re-evaluated:Patient Re-evaluated prior to induction Oxygen Delivery Method: Circle System Utilized Preoxygenation: Pre-oxygenation with 100% oxygen Induction Type: IV induction Ventilation: Mask ventilation without difficulty Laryngoscope Size: Mac and 3 Grade View: Grade I Tube type: Oral Tube size: 7.5 mm Number of attempts: 1 Airway Equipment and Method: Stylet Placement Confirmation: ETT inserted through vocal cords under direct vision,  positive ETCO2 and breath sounds checked- equal and bilateral Secured at: 20 cm Tube secured with: Tape Dental Injury: Teeth and Oropharynx as per pre-operative assessment

## 2017-07-06 MED ORDER — OXYCODONE HCL 5 MG PO TABS: 5.0000 mg | ORAL_TABLET | ORAL | 0 refills | Status: DC | PRN

## 2017-07-06 NOTE — Discharge Instructions (Signed)
[facility] ______[facility] °LAPAROSCOPIC SURGERY: POST OP INSTRUCTIONS °Always review your discharge instruction sheet given to you by the facility where your surgery was performed. °IF YOU HAVE DISABILITY OR FAMILY LEAVE FORMS, YOU MUST BRING THEM TO THE OFFICE FOR PROCESSING.   °DO NOT GIVE THEM TO YOUR DOCTOR. ° °1. A prescription for pain medication may be given to you upon discharge.  Take your pain medication as prescribed, if needed.  If narcotic pain medicine is not needed, then you may take acetaminophen (Tylenol) or ibuprofen (Advil) as needed. °2. Take your usually prescribed medications unless otherwise directed. °3. If you need a refill on your pain medication, please contact your pharmacy.  They will contact our office to request authorization. Prescriptions will not be filled after 5pm or on week-ends. °4. You should follow a light diet the first few days after arrival home, such as soup and crackers, etc.  Be sure to include lots of fluids daily. °5. Most patients will experience some swelling and bruising in the area of the incisions.  Ice packs will help.  Swelling and bruising can take several days to resolve.  °6. It is common to experience some constipation if taking pain medication after surgery.  Increasing fluid intake and taking a stool softener (such as Colace) will usually help or prevent this problem from occurring.  A mild laxative (Milk of Magnesia or Miralax) should be taken according to package instructions if there are no bowel movements after 48 hours. °7. Unless discharge instructions indicate otherwise, you may remove your bandages 24-48 hours after surgery, and you may shower at that time.  You may have steri-strips (small skin tapes) in place directly over the incision.  These strips should be left on the skin for 7-10 days.  If your surgeon used skin glue on the incision, you may shower in 24 hours.  The glue will flake off over the next 2-3 weeks.  Any sutures or  staples will be removed at the office during your follow-up visit. °8. ACTIVITIES:  You may resume regular (light) daily activities beginning the next day--such as daily self-care, walking, climbing stairs--gradually increasing activities as tolerated.  You may have sexual intercourse when it is comfortable.  Refrain from any heavy lifting or straining until approved by your doctor. °a. You may drive when you are no longer taking prescription pain medication, you can comfortably wear a seatbelt, and you can safely maneuver your car and apply brakes. °b. RETURN TO WORK:  __________________________________________________________ °9. You should see your doctor in the office for a follow-up appointment approximately 2-3 weeks after your surgery.  Make sure that you call for this appointment within a day or two after you arrive home to insure a convenient appointment time. °10. OTHER INSTRUCTIONS: __________________________________________________________________________________________________________________________ __________________________________________________________________________________________________________________________ °WHEN TO CALL YOUR DOCTOR: °1. Fever over 101.0 °2. Inability to urinate °3. Continued bleeding from incision. °4. Increased pain, redness, or drainage from the incision. °5. Increasing abdominal pain ° °The clinic staff is available to answer your questions during regular business hours.  Please don’t hesitate to call and ask to speak to one of the nurses for clinical concerns.  If you have a medical emergency, go to the nearest emergency room or call 911.  A surgeon from [facility] is always on call at the hospital. °[address], [address], [place], [place]  [address] ? [address] [address], [place], [place]   [address] °[phone] ? [phone] ? FAX [phone] °Web site:   [email or web address] °

## 2017-07-06 NOTE — Discharge Summary (Signed)
Patient ID: Kaitlyn Brooks [id number] 36 y.o. 30-May-1981  Admit date: 07/05/2017  Discharge date and time: 07/06/2017  Admitting Physician: Harden Mo  Discharge Physician: Ernestene Mention  Admission Diagnoses: RUQ abdominal pain [R10.11]  Discharge Diagnoses: Acute cholecystitis with cholelithiasis Operations: Procedure(s): LAPAROSCOPIC CHOLECYSTECTOMY UMBILICAL HERNIA REPAIR  Admission Condition: fair  Discharged Condition: good  Indication for Admission: this is a 36 year old Hispanic female otherwise healthy other than cesarean section.  She presented to the ED with a 24-hour history of abdominal pain and vomiting.  Similar episode 2016.  She localized the pain to the epigastrium and right upper quadrant.evaluation in the emergency department revealed right upper quadrant abdominal tenderness.  Ultrasound showed gallstones and fatty liver.  Clinically she was felt to have acute cholecystitis  Hospital Course: she was evaluated in the emergency department by Dr. Dwain Sarna  She was then taken to the operating room and underwent laparoscopic cholecystectomy and primary umbilical hernia repair.  Surgery was uneventful.  She was observed overnight and did very well.  On postop day 1 she was alert and ambulatory.  Voiding without difficulty.  Tolerating and diet.  Asking to go home.  Her abdominal exam was benign revealing a soft nondistended abdomen.  Wounds looked good.  Essentially nontender.   She was given instructions in diet and activities.  She was given a prescription for oxycodone for pain.  She was asked to return [facility] in[facility] weeks.  Consults: None  Significant Diagnostic Studies: Blood work and gallbladder ultrasound and surgical pathology  Treatments: surgery: laparoscopic cholecystectomy, rubbery umbilical hernia repair  Disposition: Home  Patient Instructions:  Allergies as of 07/06/2017   No Known Allergies      Medication List    TAKE these medications   acetaminophen 500 MG tablet Commonly known as:  TYLENOL Take 1,000 mg by mouth every 6 (six) hours as needed for moderate pain.   multivitamin with minerals Tabs tablet Take 1 tablet by mouth daily.   oxyCODONE 5 MG immediate release tablet Commonly known as:  Oxy IR/ROXICODONE Take 1-2 tablets (5-10 mg total) by mouth every 4 (four) hours as needed for moderate pain.            Discharge Care Instructions        Start     Ordered   07/06/17 0000  oxyCODONE (OXY IR/ROXICODONE) 5 MG immediate release tablet  Every 4 hours PRN     07/06/17 0648   07/06/17 0000  Increase activity slowly     07/06/17 0648   07/06/17 0000  Diet - low sodium heart healthy     07/06/17 1497   07/06/17 0000  Discharge instructions    Comments:  [facility] ______[facility] LAPAROSCOPIC SURGERY: POST OP INSTRUCTIONS Always review your discharge instruction sheet given to you by the facility where your surgery was performed. IF YOU HAVE DISABILITY OR FAMILY LEAVE FORMS, YOU MUST BRING THEM TO THE OFFICE FOR PROCESSING.   DO NOT GIVE THEM TO YOUR DOCTOR.  A prescription for pain medication may be given to you upon discharge.  Take your pain medication as prescribed, if needed.  If narcotic pain medicine is not needed, then you may take acetaminophen (Tylenol) or ibuprofen (Advil) as needed. Take your usually prescribed medications unless otherwise directed. If you need a refill on your pain medication, please contact your pharmacy.  They will contact our office to request authorization. Prescriptions will not be filled after 5pm or  on week-ends. You should follow a light diet the first few days after arrival home, such as soup and crackers, etc.  Be sure to include lots of fluids daily. Most patients will experience some swelling and bruising in the area of the incisions.  Ice packs will help.  Swelling and bruising can take several days to  resolve.  It is common to experience some constipation if taking pain medication after surgery.  Increasing fluid intake and taking a stool softener (such as Colace) will usually help or prevent this problem from occurring.  A mild laxative (Milk of Magnesia or Miralax) should be taken according to package instructions if there are no bowel movements after 48 hours. Unless discharge instructions indicate otherwise, you may remove your bandages 24-48 hours after surgery, and you may shower at that time.  You may have steri-strips (small skin tapes) in place directly over the incision.  These strips should be left on the skin for 7-10 days.  If your surgeon used skin glue on the incision, you may shower in 24 hours.  The glue will flake off over the next 2-3 weeks.  Any sutures or staples will be removed at the office during your follow-up visit. ACTIVITIES:  You may resume regular (light) daily activities beginning the next day-such as daily self-care, walking, climbing stairs-gradually increasing activities as tolerated.  You may have sexual intercourse when it is comfortable.  Refrain from any heavy lifting or straining until approved by your doctor. You may drive when you are no longer taking prescription pain medication, you can comfortably wear a seatbelt, and you can safely maneuver your car and apply brakes. RETURN TO WORK:  __________________________________________________________ Bonita Quin should see your doctor in the office for a follow-up appointment approximately 2-3 weeks after your surgery.  Make sure that you call for this appointment within a day or two after you arrive home to insure a convenient appointment time. OTHER INSTRUCTIONS: __________________________________________________________________________________________________________________________ __________________________________________________________________________________________________________________________ WHEN TO CALL YOUR  DOCTOR: Fever over 101.0 Inability to urinate Continued bleeding from incision. Increased pain, redness, or drainage from the incision. Increasing abdominal pain  The clinic staff is available to answer your questions during regular business hours.  Please don't hesitate to call and ask to speak to one of the nurses for clinical concerns.  If you have a medical emergency, go to the nearest emergency room or call 911.  A surgeon from [facility] is always on call at the hospital. [address], [place], [place]  [address] ? [address] [address], [place], [place]   [address] [phone] ? [phone] ? FAX [phone] Web site: [email or web address]   07/06/17 0648   07/06/17 0000  May walk up steps     07/06/17 0648   07/06/17 0000  May shower / [place]     07/06/17 0648   07/06/17 0000  Driving Restrictions    Comments:  2-4 days   07/06/17 0648   07/06/17 0000  Lifting restrictions    Comments:  20 lbs for 3 weeks   07/06/17 0648   07/06/17 0000  No wound care     07/06/17 0648   07/06/17 0000  Call MD for:  persistant dizziness or light-headedness     07/06/17 0648   07/06/17 0000  Call MD for:  hives     07/06/17 0648   07/06/17 0000  Call MD for:  difficulty breathing, headache or visual disturbances     07/06/17 0648   07/06/17 0000  Call MD for:  redness, tenderness, or signs of infection (pain, swelling, redness, odor or green/yellow discharge around incision site)     07/06/17 0648   07/06/17 0000  Call MD for:  severe uncontrolled pain     07/06/17 0648   07/06/17 0000  Call MD for:  persistant nausea and vomiting     07/06/17 0648   07/06/17 0000  Call MD for:  temperature >100.4     07/06/17 0648      Activity: no heavy lifting for 3 weeks Diet: low fat, low cholesterol diet Wound Care: none needed  Follow-up:  With [facility] surgery clinic in 3 weeks.  Signed: Angelia Mould. Derrell Lolling, M.D., FACS General and minimally  invasive surgery Breast and Colorectal Surgery   Addendum: I logged onto the [facility]  website and reviewed her prescription medication history  07/06/2017, 6:49 AM

## 2017-07-07 ENCOUNTER — Encounter (HOSPITAL_COMMUNITY): Payer: Self-pay | Admitting: General Surgery

## 2017-07-09 NOTE — Anesthesia Postprocedure Evaluation (Signed)
Anesthesia Post Note  Patient: Kaitlyn Brooks  Procedure(s) Performed: Procedure(s) (LRB): LAPAROSCOPIC CHOLECYSTECTOMY (N/A) UMBILICAL HERNIA REPAIR (N/A)     Patient location during evaluation: PACU Anesthesia Type: General Level of consciousness: awake and alert Pain management: pain level controlled Vital Signs Assessment: post-procedure vital signs reviewed and stable Respiratory status: spontaneous breathing, nonlabored ventilation, respiratory function stable and patient connected to nasal cannula oxygen Cardiovascular status: blood pressure returned to baseline and stable Postop Assessment: no signs of nausea or vomiting Anesthetic complications: no    Last Vitals:  Vitals:   07/06/17 0227 07/06/17 0606  BP: 112/64 113/63  Pulse: 80 90  Resp: 17 17  Temp: 37 C 36.9 C  SpO2: 94% 94%    Last Pain:  Vitals:   07/06/17 1103  TempSrc:   PainSc: 2                  Shelton Silvas

## 2018-06-02 ENCOUNTER — Emergency Department (HOSPITAL_COMMUNITY)
Admission: EM | Admit: 2018-06-02 | Discharge: 2018-06-02 | Disposition: A | Discharge status: Home / Self Care | Payer: Self-pay | Attending: Emergency Medicine | Admitting: Emergency Medicine

## 2018-06-02 ENCOUNTER — Emergency Department (HOSPITAL_COMMUNITY): Payer: Self-pay

## 2018-06-02 ENCOUNTER — Other Ambulatory Visit: Payer: Self-pay

## 2018-06-02 ENCOUNTER — Encounter (HOSPITAL_COMMUNITY): Payer: Self-pay | Admitting: *Deleted

## 2018-06-02 DIAGNOSIS — O039 Complete or unspecified spontaneous abortion without complication: Secondary | ICD-10-CM | POA: Insufficient documentation

## 2018-06-02 DIAGNOSIS — R102 Pelvic and perineal pain: Secondary | ICD-10-CM | POA: Insufficient documentation

## 2018-06-02 DIAGNOSIS — N939 Abnormal uterine and vaginal bleeding, unspecified: Secondary | ICD-10-CM

## 2018-06-02 DIAGNOSIS — Z3A09 9 weeks gestation of pregnancy: Secondary | ICD-10-CM | POA: Insufficient documentation

## 2018-06-02 LAB — CBC WITH DIFFERENTIAL/PLATELET
Abs Immature Granulocytes: 0.1 10*3/uL (ref 0.0–0.1)
BASOS ABS: 0.1 10*3/uL (ref 0.0–0.1)
BASOS PCT: 1 %
EOS ABS: 0.3 10*3/uL (ref 0.0–0.7)
Eosinophils Relative: 2 %
HCT: 43.8 % (ref 36.0–46.0)
Hemoglobin: 13.3 g/dL (ref 12.0–15.0)
Immature Granulocytes: 0 %
Lymphocytes Relative: 25 %
Lymphs Abs: 3.3 10*3/uL (ref 0.7–4.0)
MCH: 26 pg (ref 26.0–34.0)
MCHC: 30.4 g/dL (ref 30.0–36.0)
MCV: 85.7 fL (ref 78.0–100.0)
MONO ABS: 0.9 10*3/uL (ref 0.1–1.0)
MONOS PCT: 7 %
Neutro Abs: 8.5 10*3/uL — ABNORMAL HIGH (ref 1.7–7.7)
Neutrophils Relative %: 65 %
PLATELETS: 404 10*3/uL — AB (ref 150–400)
RBC: 5.11 MIL/uL (ref 3.87–5.11)
RDW: 14.1 % (ref 11.5–15.5)
WBC: 13.1 10*3/uL — ABNORMAL HIGH (ref 4.0–10.5)

## 2018-06-02 LAB — URINALYSIS, ROUTINE W REFLEX MICROSCOPIC
Bilirubin Urine: NEGATIVE
GLUCOSE, UA: NEGATIVE mg/dL
KETONES UR: NEGATIVE mg/dL
LEUKOCYTES UA: NEGATIVE
Nitrite: NEGATIVE
PH: 6 (ref 5.0–8.0)
PROTEIN: NEGATIVE mg/dL
RBC / HPF: >50 RBC/hpf — ABNORMAL HIGH (ref 0–5)
Specific Gravity, Urine: 1.006 (ref 1.005–1.030)

## 2018-06-02 LAB — WET PREP, GENITAL
Clue Cells Wet Prep HPF POC: NONE SEEN
SPERM: NONE SEEN
Trich, Wet Prep: NONE SEEN
WBC, Wet Prep HPF POC: NONE SEEN
Yeast Wet Prep HPF POC: NONE SEEN

## 2018-06-02 LAB — CBC
HEMATOCRIT: 41.1 % (ref 36.0–46.0)
Hemoglobin: 13.1 g/dL (ref 12.0–15.0)
MCH: 26.5 pg (ref 26.0–34.0)
MCHC: 31.9 g/dL (ref 30.0–36.0)
MCV: 83.2 fL (ref 78.0–100.0)
Platelets: 391 10*3/uL (ref 150–400)
RBC: 4.94 MIL/uL (ref 3.87–5.11)
RDW: 13.9 % (ref 11.5–15.5)
WBC: 16.8 10*3/uL — ABNORMAL HIGH (ref 4.0–10.5)

## 2018-06-02 LAB — BASIC METABOLIC PANEL
Anion gap: 9 (ref 5–15)
BUN: 7 mg/dL (ref 6–20)
CALCIUM: 9.1 mg/dL (ref 8.9–10.3)
CO2: 27 mmol/L (ref 22–32)
Chloride: 104 mmol/L (ref 98–111)
Creatinine, Ser: 0.65 mg/dL (ref 0.44–1.00)
GFR calc Af Amer: >60 mL/min (ref >60)
GFR calc non Af Amer: >60 mL/min (ref >60)
GLUCOSE: 107 mg/dL — AB (ref 70–99)
Potassium: 4.1 mmol/L (ref 3.5–5.1)
SODIUM: 140 mmol/L (ref 135–145)

## 2018-06-02 LAB — ABO/RH: ABO/RH(D): O POS

## 2018-06-02 LAB — HCG, QUANTITATIVE, PREGNANCY: hCG, Beta Chain, Quant, S: 1791 m[IU]/mL — ABNORMAL HIGH (ref <5)

## 2018-06-02 LAB — I-STAT BETA HCG BLOOD, ED (MC, WL, AP ONLY)

## 2018-06-02 MED ORDER — ACETAMINOPHEN 325 MG PO TABS: 650.0000 mg | ORAL_TABLET | Freq: Four times a day (QID) | ORAL | 0 refills | Status: DC | PRN

## 2018-06-02 MED ORDER — ACETAMINOPHEN 325 MG PO TABS: 650.0000 mg | ORAL_TABLET | Freq: Four times a day (QID) | ORAL | 0 refills | Status: AC | PRN

## 2018-06-02 MED ORDER — ACETAMINOPHEN 500 MG PO TABS
1000.0000 mg | ORAL_TABLET | Freq: Once | ORAL | Status: AC
  Administered 2018-06-02: 1000 mg via ORAL
  Filled 2018-06-02: qty 2

## 2018-06-02 NOTE — ED Notes (Signed)
Signature pad unavailable at time of pt discharge. Pt verbalized understanding of d/c instructions and follow up care.

## 2018-06-02 NOTE — Discharge Instructions (Signed)
We saw you in the ER for vaginal bleeding. Ultrasound reveals no clear evidence of pregnancy, which makes us think that you have likely miscarried. If this is indeed miscarriage, then the bleeding and the pain will get better over time.  As discussed, if this is an ectopic pregnancy, then your symptoms will get worse over time.  We advised her to return to the ER immediately if you start having worsening pain, worsening bleeding, dizziness, fainting, severe weakness, or confusion.  Otherwise please [facility] with [facility] Thursday for repeat blood work.

## 2018-06-02 NOTE — ED Notes (Signed)
ED Provider at bedside. 

## 2018-06-02 NOTE — ED Provider Notes (Signed)
Patient placed in Quick Look pathway, seen and evaluated   Chief Complaint:Bleeding and pregnancy  HPI:   Pt is early pregnant approx 8 weeks.  Pt complains of bleeding  ROS: no  Fever, no abdominal pain  Physical Exam:   Gen: No distress  Neuro: Awake and Alert  Skin: Warm    Focused Exam: Normal respiration, hear rrr   Initiation of care has begun. The patient has been counseled on the process, plan, and necessity for staying for the completion/evaluation, and the remainder of the medical screening examination   Osie CheeksSofia, Elton Heid K, PA-C 06/02/18 1552    Derwood KaplanNanavati, Ankit, MD 06/04/18 Earle [id number]

## 2018-06-02 NOTE — ED Triage Notes (Signed)
Pt reports being approx 2 months pregnant, lmp in May. No prenatal care during this pregnancy. Started having cramping and bleeding yesterday. Reports moderate bleeding with clots. No acute distress is noted at triage.

## 2018-06-02 NOTE — ED Notes (Signed)
Called US.  They have sent for pt.

## 2018-06-02 NOTE — ED Notes (Signed)
Patient transported to Ultrasound 

## 2018-06-03 LAB — GC/CHLAMYDIA PROBE AMP (~~LOC~~) NOT AT ARMC
Chlamydia: NEGATIVE
Neisseria Gonorrhea: NEGATIVE

## 2018-06-04 NOTE — ED Provider Notes (Signed)
[facility] EMERGENCY DEPARTMENT Provider Note   CSN: [id number] Arrival date & time: 06/02/18  1523     History   Chief Complaint Chief Complaint  Patient presents with  . Vaginal Bleeding    HPI Kaitlyn Brooks is a 37 y.o. female.  HPI  37 year old female comes in with chief complaint of vaginal bleeding. Patient is G3, P2, and she found her last week that she was pregnant.  Patient is in her first trimester.  She reports that she started having pelvic pain along with associated vaginal bleeding yesterday.  Patient's bleeding is not heavy, however it is heavier than her periods.  Patient's abdominal pain is in the lower quadrants and described as cramping in nature and it radiates to her back.  Patient denies any dizziness, lightheadedness, sweating, chest pain, shortness of breath.  Patient does not have any bleeding disorder.  Her previous pregnancies were not complicated.  Past Medical History:  Diagnosis Date  . Cholecystitis   . Cholecystitis 07/05/2017    Patient Active Problem List   Diagnosis Date Noted  . Cholecystitis 07/05/2017  . S/P laparoscopic cholecystectomy 07/05/2017    Past Surgical History:  Procedure Laterality Date  . CHOLECYSTECTOMY N/A 07/05/2017   Procedure: LAPAROSCOPIC CHOLECYSTECTOMY;  Surgeon: Emelia Loron, MD;  Location: [facility] OR;  Service: General;  Laterality: N/A;  . UMBILICAL HERNIA REPAIR N/A 07/05/2017   Procedure: UMBILICAL HERNIA REPAIR;  Surgeon: Emelia Loron, MD;  Location: [facility] OR;  Service: General;  Laterality: N/A;     OB History    Gravida  3   Para      Term      Preterm      AB      Living        SAB      TAB      Ectopic      Multiple      Live Births               Home Medications    Prior to Admission medications   Medication Sig Start Date End Date Taking? Authorizing Provider  Prenatal Vit-Fe Fumarate-FA (MULTIVITAMIN-PRENATAL) 27-0.8 MG TABS tablet Take 1  tablet by mouth daily at 12 noon.   Yes [provider]  acetaminophen (TYLENOL) 325 MG tablet Take 2 tablets (650 mg total) by mouth every 6 (six) hours as needed. 06/02/18   Derwood Kaplan, MD  oxyCODONE (OXY IR/ROXICODONE) 5 MG immediate release tablet Take 1-2 tablets (5-10 mg total) by mouth every 4 (four) hours as needed for moderate pain. Patient not taking: Reported on 06/02/2018 07/06/17   Claud Kelp, MD    Family History History reviewed. No pertinent family history.  Social History Social History   Tobacco Use  . Smoking status: Never Smoker  . Smokeless tobacco: Never Used  Substance Use Topics  . Alcohol use: No  . Drug use: No     Allergies   Patient has no known allergies.   Review of Systems Review of Systems  Constitutional: Positive for activity change.  Gastrointestinal: Positive for abdominal pain.  Neurological: Negative for dizziness.  Hematological: Does not bruise/bleed easily.  All other systems reviewed and are negative.    Physical Exam Updated Vital Signs BP 115/72 (BP Location: Right Arm)   Pulse 88   Temp 99.2 F (37.3 C) (Oral)   Resp 18   LMP 03/26/2018 (Approximate)   SpO2 96%   Physical Exam  Constitutional: She is oriented  to person, place, and time. She appears well-developed.  HENT:  Head: Normocephalic and atraumatic.  Eyes: Pupils are equal, round, and reactive to light. Conjunctivae and EOM are normal.  Neck: Normal range of motion. Neck supple.  Cardiovascular: Normal rate, regular rhythm, normal heart sounds and intact distal pulses.  No murmur heard. Pulmonary/Chest: Effort normal. No respiratory distress. She has no wheezes.  Abdominal: Soft. Bowel sounds are normal. She exhibits no distension. There is tenderness. There is no guarding.  Genitourinary: Vagina normal and uterus normal.  Genitourinary Comments: External exam - normal, no lesions Speculum exam: Pt has large amount of blood in the vaginal  vault Bimanual exam: Patient has no CMT, no adnexal tenderness or fullness and cervical os is open, about half a finger-width size.  Neurological: She is alert and oriented to person, place, and time.  Skin: Skin is warm and dry.  Nursing note and vitals reviewed.    ED Treatments / Results  Labs (all labs ordered are listed, but only abnormal results are displayed) Labs Reviewed  CBC WITH DIFFERENTIAL/PLATELET - Abnormal; Notable for the following components:      Result Value   WBC 13.1 (*)    Platelets 404 (*)    Neutro Abs 8.5 (*)    All other components within normal limits  BASIC METABOLIC PANEL - Abnormal; Notable for the following components:   Glucose, Bld 107 (*)    All other components within normal limits  URINALYSIS, ROUTINE W REFLEX MICROSCOPIC - Abnormal; Notable for the following components:   Hgb urine dipstick LARGE (*)    RBC / HPF >50 (*)    Bacteria, UA RARE (*)    Crystals PRESENT (*)    All other components within normal limits  CBC - Abnormal; Notable for the following components:   WBC 16.8 (*)    All other components within normal limits  HCG, QUANTITATIVE, PREGNANCY - Abnormal; Notable for the following components:   hCG, Beta Chain, Quant, S 1,791 (*)    All other components within normal limits  I-STAT BETA HCG BLOOD, ED ([facility], [facility], [facility] ONLY) - Abnormal; Notable for the following components:   I-stat hCG, quantitative >2,000.0 (*)    All other components within normal limits  WET PREP, GENITAL  ABO/RH  GC/CHLAMYDIA PROBE AMP ([place]) NOT AT [facility]    EKG None  Radiology US Ob Comp Less 14 Wks  Result Date: 06/02/2018 CLINICAL DATA:  Vaginal bleeding and cramping. Gestational age by last menstrual period 9 weeks and 5 days. Beta hCG greater than 2000. EXAM: OBSTETRIC <14 WK [place] AND TRANSVAGINAL OB US TECHNIQUE: Both transabdominal and transvaginal ultrasound examinations were performed for complete evaluation of the gestation as well as the  maternal uterus, adnexal regions, and pelvic cul-de-sac. Transvaginal technique was performed to assess early pregnancy. COMPARISON:  None. FINDINGS: Habitus limited examination.  Posterior location of ovaries. Intrauterine gestational sac: None. Yolk sac:  None. Embryo:  None. Cardiac Activity: Not applicable. Subchorionic hemorrhage:  None visualized. Maternal uterus/adnexae: Uterus is 12 x 4.9 x 6.7 cm. Mobile debris within the lower uterine segment and upper cervix. Normal appearance of the adnexa though, ovaries only identified on transabdominal sonogram. Probable corpus luteal cyst measuring 17 mm on the LEFT. Trace free fluid. IMPRESSION: 1. No sonographically identified IUP. This represents a pregnancy of unknown location. Given debris within endometrium and cervix, this could reflect a miscarriage in progress. Recommend correlation with serial beta-hCG levels, and follow up US if warranted  clinically. Please note, with beta HCG of less than 3,000, pregnancy may not be sonographically evident. 2. Ovaries not sonographically identified on endovaginal imaging. 17 mm probable LEFT corpus luteal cyst. Electronically Signed   By: Awilda Metro M.D.   On: 06/02/2018 20:15   US Ob Transvaginal  Result Date: 06/02/2018 CLINICAL DATA:  Vaginal bleeding and cramping. Gestational age by last menstrual period 9 weeks and 5 days. Beta hCG greater than 2000. EXAM: OBSTETRIC <14 WK [place] AND TRANSVAGINAL OB US TECHNIQUE: Both transabdominal and transvaginal ultrasound examinations were performed for complete evaluation of the gestation as well as the maternal uterus, adnexal regions, and pelvic cul-de-sac. Transvaginal technique was performed to assess early pregnancy. COMPARISON:  None. FINDINGS: Habitus limited examination.  Posterior location of ovaries. Intrauterine gestational sac: None. Yolk sac:  None. Embryo:  None. Cardiac Activity: Not applicable. Subchorionic hemorrhage:  None visualized. Maternal  uterus/adnexae: Uterus is 12 x 4.9 x 6.7 cm. Mobile debris within the lower uterine segment and upper cervix. Normal appearance of the adnexa though, ovaries only identified on transabdominal sonogram. Probable corpus luteal cyst measuring 17 mm on the LEFT. Trace free fluid. IMPRESSION: 1. No sonographically identified IUP. This represents a pregnancy of unknown location. Given debris within endometrium and cervix, this could reflect a miscarriage in progress. Recommend correlation with serial beta-hCG levels, and follow up US if warranted clinically. Please note, with beta HCG of less than 3,000, pregnancy may not be sonographically evident. 2. Ovaries not sonographically identified on endovaginal imaging. 17 mm probable LEFT corpus luteal cyst. Electronically Signed   By: Awilda Metro M.D.   On: 06/02/2018 20:15    Procedures Procedures (including critical care time)  Medications Ordered in ED Medications  acetaminophen (TYLENOL) tablet 1,000 mg (1,000 mg Oral Given 06/02/18 1800)     Initial Impression / Assessment and Plan / ED Course  I have reviewed the triage vital signs and the nursing notes.  Pertinent labs & imaging results that were available during my care of the patient were reviewed by me and considered in my medical decision making (see chart for details).     37 year old female comes in with chief complaint of vaginal bleeding.  Patient is G3, P2, healthy and she found out that she is pregnant last week. Patient is having cramping abdominal pain, vaginal bleeding with clots passing.  Cervical loss is open on exam.  Ultrasound does not show any IUP, I think patient might have incomplete miscarriage at this time.  Ectopic cannot be ruled out, however pretest probability for it is low.  Repeat hemoglobin ordered and is steady at 13.1.  I spoke with the [facility] doctor on call.  We discussed the patient's presenting symptoms along with her pelvic exam finding and her complains of  persistent pain.  Dr. Earlene Plater also thought that patient is unlikely to be having ectopic pregnancy.  She recommends that patient be discharged, and they will have their office call her for an appointment on Thursday morning for repeat beta-hCG.  We discussed our concerns with the patient.  We have discussed with her that ectopic pregnancy is less likely, however not completely ruled out, therefore she will need to return to the ER immediately if her bleeding gets worse, pain gets worse, she has shortness of breath, weakness, dizziness or fainting.  Patient is in agreement with the plan.  Final Clinical Impressions(s) / ED Diagnoses   Final diagnoses:  Vaginal bleeding  Miscarriage    ED Discharge Orders  Ordered    acetaminophen (TYLENOL) 325 MG tablet  Every 6 hours PRN,   Status:  Discontinued     06/02/18 2147    acetaminophen (TYLENOL) 325 MG tablet  Every 6 hours PRN     06/02/18 2148       Derwood KaplanNanavati, Elis Rawlinson, MD 06/04/18 [phone]

## 2018-06-10 ENCOUNTER — Telehealth: Payer: Self-pay | Admitting: General Practice

## 2018-06-10 NOTE — Telephone Encounter (Signed)
Left message on voicemail for patient to give us a call back to schedule appt for non-stat HCG.

## 2018-09-10 IMAGING — US US OB COMP LESS 14 WK
1 series · 13 of 28 positions shown · non-contrast
Comparison: None.

CLINICAL DATA: Vaginal bleeding and cramping. Gestational age by
last menstrual period 9 weeks and 5 days. Beta hCG greater than
4666.

EXAM:
OBSTETRIC <14 WK US AND TRANSVAGINAL OB US
TECHNIQUE: Both transabdominal and transvaginal ultrasound examinations were
performed for complete evaluation of the gestation as well as the
maternal uterus, adnexal regions, and pelvic cul-de-sac.
Transvaginal technique was performed to assess early pregnancy.

[Series 1: us ob comp less 14 wk · 0.22mm/px · 71 acquisitions, 13 frames shown]
[im 3/71]
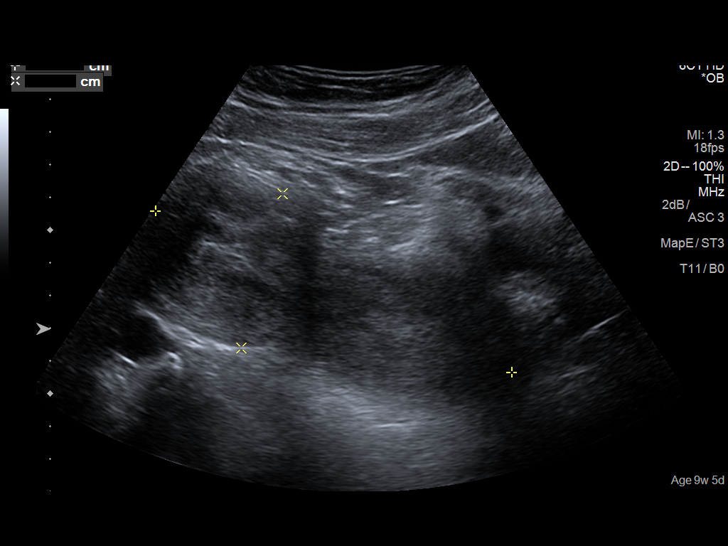
[im 8/71]
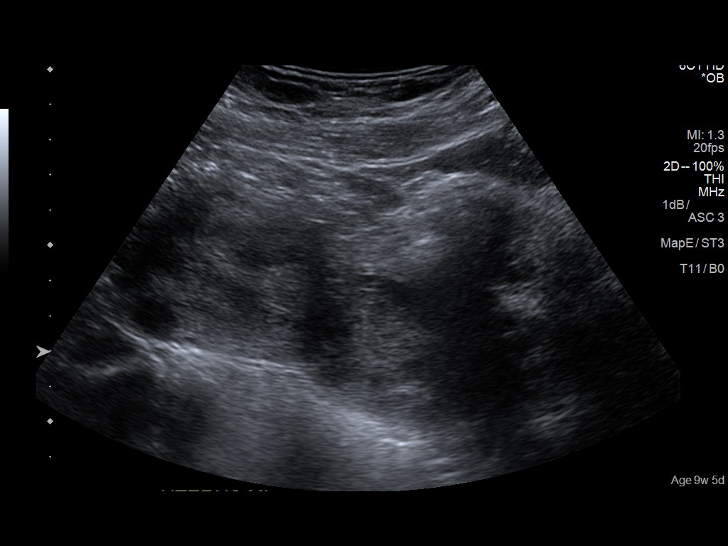
[im 13/71]
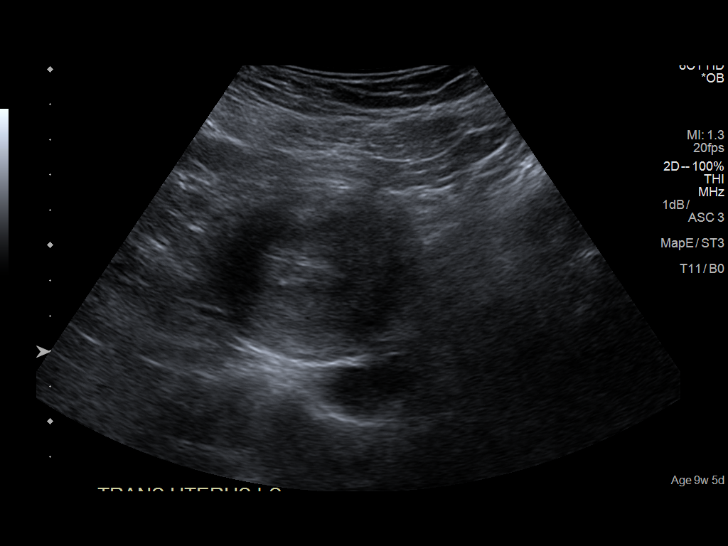
[im 19/71]
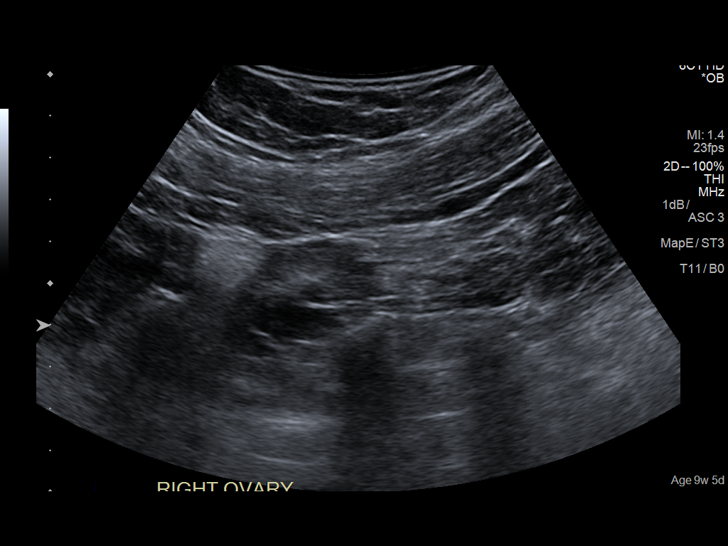
[im 24/71]
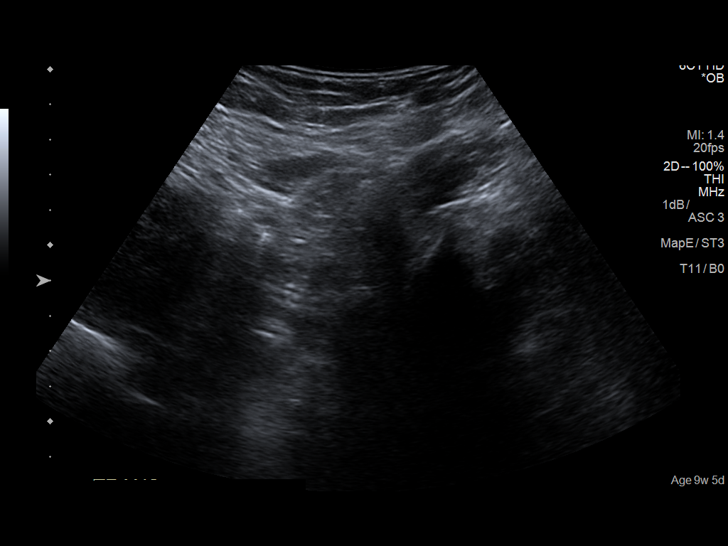
[im 29/71]
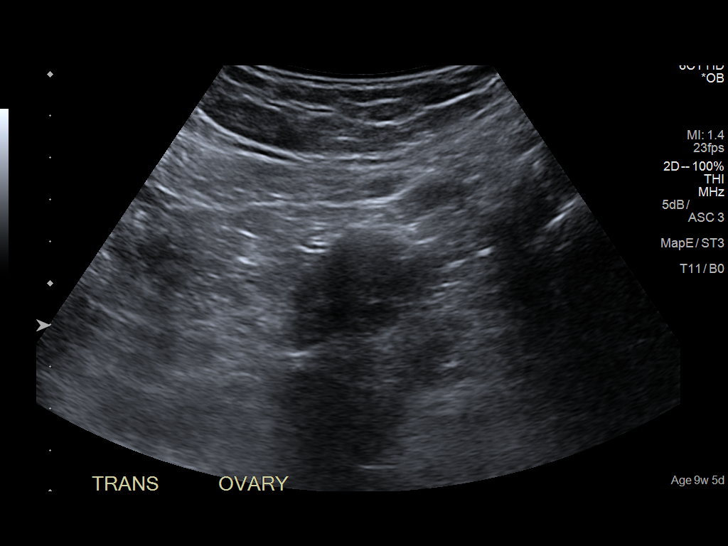
[im 37/71]
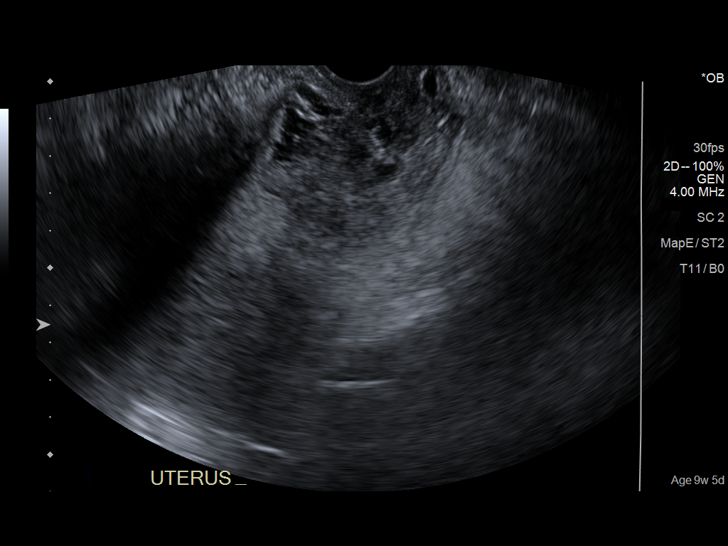
[im 42/71]
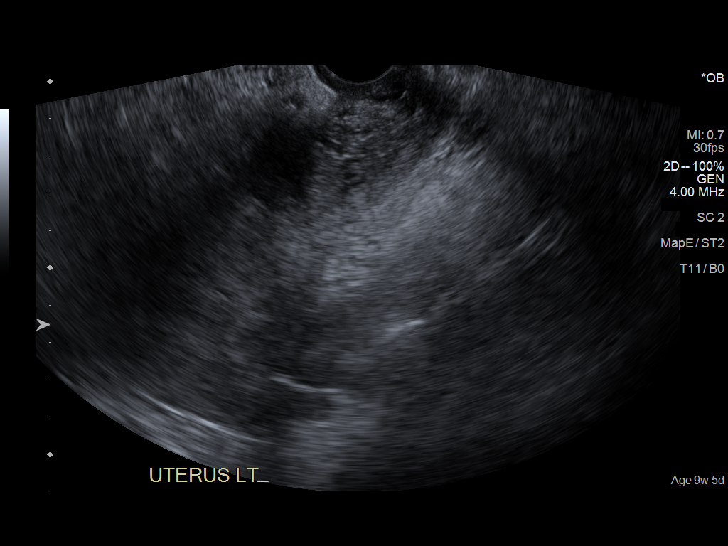
[im 47/71]
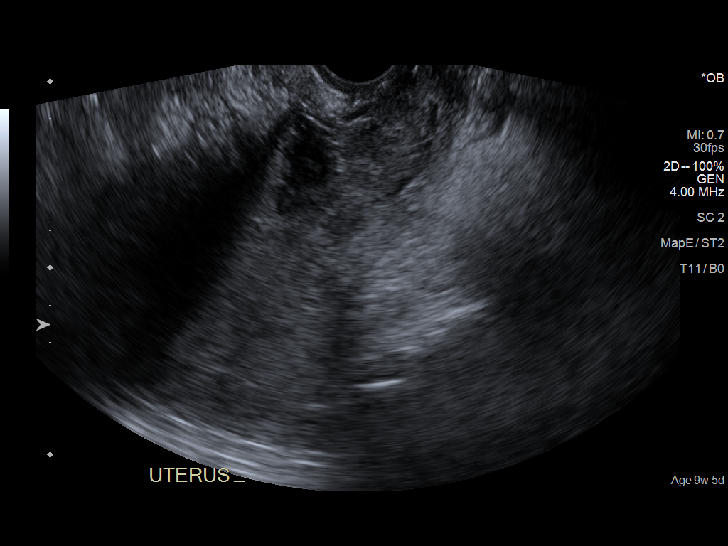
[im 52/71]
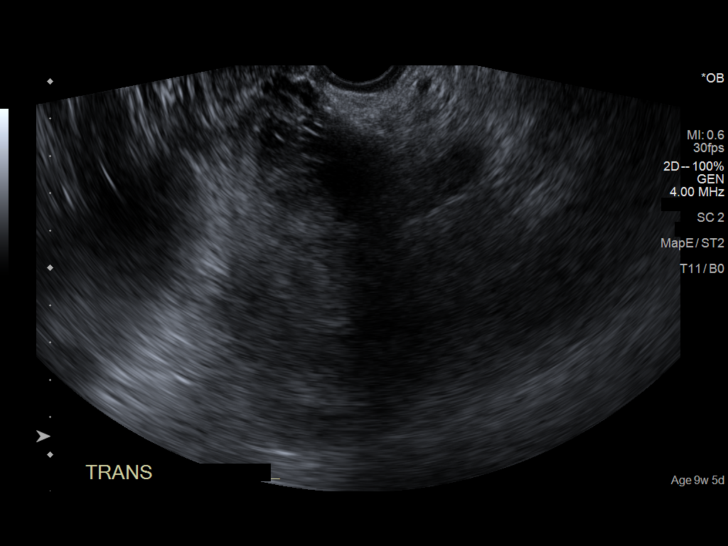
[im 58/71]
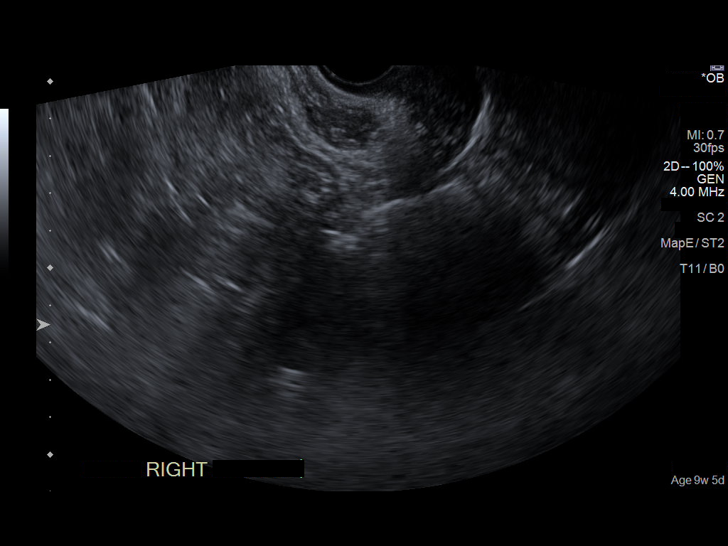
[im 63/71]
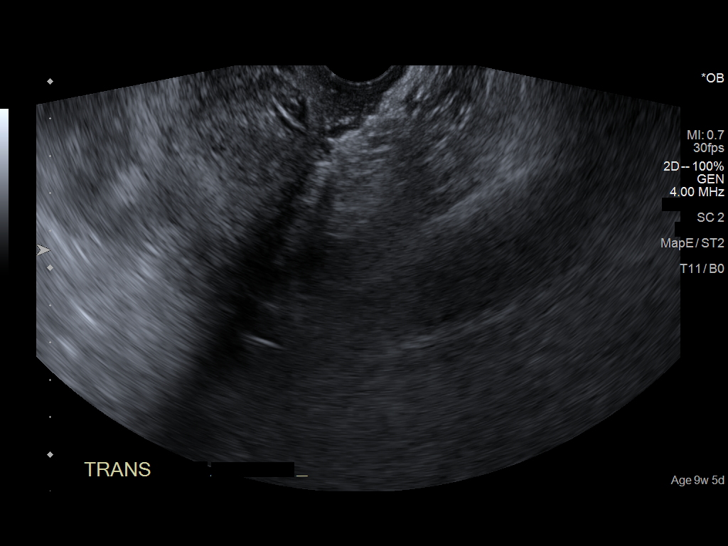
[im 68/71]
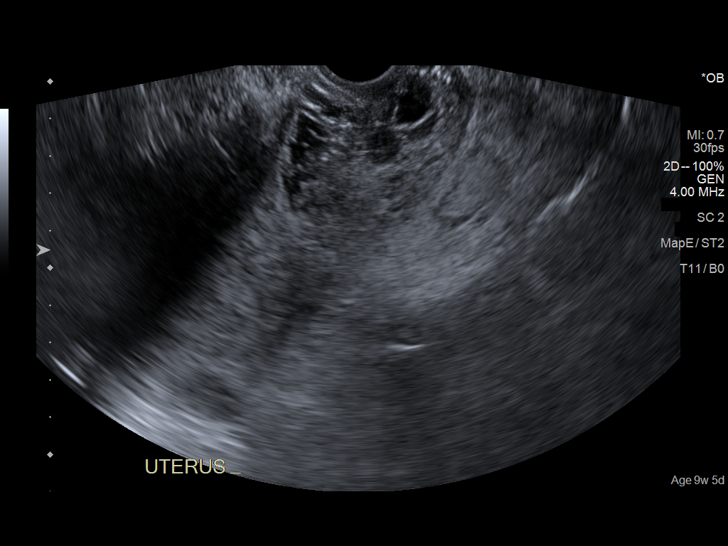

[13 of 28 positions shown; findings below may reference images not displayed]

FINDINGS: Habitus limited examination.  Posterior location of ovaries.

Intrauterine gestational sac: None.

Yolk sac:  None.

Embryo:  None.

Cardiac Activity: Not applicable.

Subchorionic hemorrhage:  None visualized.

Maternal uterus/adnexae: Uterus is 12 x 4.9 x 6.7 cm. Mobile debris
within the lower uterine segment and upper cervix. Normal appearance
of the adnexa though, ovaries only identified on transabdominal
sonogram. Probable corpus luteal cyst measuring 17 mm on the LEFT.
Trace free fluid.
IMPRESSION: 1. No sonographically identified IUP. This represents a pregnancy of
unknown location. Given debris within endometrium and cervix, this
could reflect a miscarriage in progress. Recommend correlation with
serial beta-hCG levels, and follow up US if warranted clinically.
Please note, with beta HCG of less than [DATE], pregnancy may not be
sonographically evident.
2. Ovaries not sonographically identified on endovaginal imaging. 17
mm probable LEFT corpus luteal cyst.

## 2021-08-21 ENCOUNTER — Ambulatory Visit (INDEPENDENT_AMBULATORY_CARE_PROVIDER_SITE_OTHER): Payer: Self-pay

## 2021-08-21 ENCOUNTER — Other Ambulatory Visit: Payer: Self-pay

## 2021-08-21 VITALS — BP 130/79 | HR 85 | Wt 235.0 lb

## 2021-08-21 DIAGNOSIS — Z3201 Encounter for pregnancy test, result positive: Secondary | ICD-10-CM

## 2021-08-21 DIAGNOSIS — O26859 Spotting complicating pregnancy, unspecified trimester: Secondary | ICD-10-CM

## 2021-08-21 DIAGNOSIS — Z3202 Encounter for pregnancy test, result negative: Secondary | ICD-10-CM

## 2021-08-21 LAB — POCT PREGNANCY, URINE: Preg Test, Ur: NEGATIVE

## 2021-08-21 NOTE — Progress Notes (Signed)
Patient was assessed and managed by nursing staff during this encounter. I have reviewed the chart and agree with the documentation and plan. I have also made any necessary editorial changes.  Warden Fillers, MD 08/21/2021 2:48 PM

## 2021-08-21 NOTE — Progress Notes (Signed)
LMP 05/28/2021  +UPT at home on 9/29 was positive. And today's UPT at the office was negative. Pt given results.   Pt states had vaginal bleeding that started on 9/29 at night , changed pads every 4 hours. and still bleeding today that is lighter and only changing a pad once or twice a day. Denies heavy bleeding or clots.    Reviewed with Dr Donavan Foil. Pt advised more than likely had SAB. Pt advised to have non-stat beta as completion of pregnancy. Pt verbalized understanding and agreeable to plan of care. Pt tearful and would like to talk with provider about multiple SABs.   Judeth Cornfield, RN

## 2021-08-22 LAB — BETA HCG QUANT (REF LAB): hCG Quant: 5 m[IU]/mL

## 2021-08-23 ENCOUNTER — Telehealth: Payer: Self-pay

## 2021-08-23 NOTE — Telephone Encounter (Signed)
-----   Message from Warden Fillers, MD sent at 08/23/2021 10:13 AM EDT ----- Bhcg 5, currently in nonpregnant range, no further testing at this time

## 2021-08-23 NOTE — Telephone Encounter (Signed)
Call placed to pt with interpreter Eda. Spouse answered and will give message to pt to return call in 30 mins.  Judeth Cornfield, RN

## 2021-08-23 NOTE — Telephone Encounter (Signed)
Pt did not answer x 3. Will send pt letter with results to address on file.  Judeth Cornfield, RN

## 2021-09-07 ENCOUNTER — Encounter: Payer: Self-pay | Admitting: Family Medicine

## 2021-09-07 ENCOUNTER — Other Ambulatory Visit: Payer: Self-pay

## 2021-09-07 ENCOUNTER — Ambulatory Visit (INDEPENDENT_AMBULATORY_CARE_PROVIDER_SITE_OTHER): Payer: Self-pay | Admitting: Family Medicine

## 2021-09-07 VITALS — BP 125/86 | HR 85 | Ht 60.5 in | Wt 241.0 lb

## 2021-09-07 DIAGNOSIS — Z23 Encounter for immunization: Secondary | ICD-10-CM

## 2021-09-07 DIAGNOSIS — N96 Recurrent pregnancy loss: Secondary | ICD-10-CM

## 2021-09-07 MED ORDER — PROGESTERONE 200 MG PO CAPS: 200.0000 mg | ORAL_CAPSULE | Freq: Every day | ORAL | 4 refills | Status: AC

## 2021-09-07 NOTE — Progress Notes (Signed)
   GYNECOLOGY PROBLEM  VISIT ENCOUNTER NOTE  Subjective:   Kaitlyn Brooks is a 40 y.o. G59P0 female here for a problem GYN visit.  Current complaints: multiple SABs and wants to know "why".     Most recent SAB was in Sept and has not had menses since. Prior to SAB had regular monthly cycles.   Denies abnormal vaginal bleeding, discharge, pelvic pain, problems with intercourse or other gynecologic concerns.    Gynecologic History No LMP recorded (lmp unknown). Contraception: none  Health Maintenance Due  Topic Date Due   HIV Screening  Never done   Hepatitis C Screening  Never done   TETANUS/TDAP  Never done   PAP SMEAR-Modifier  Never done     The following portions of the patient's history were reviewed and updated as appropriate: allergies, current medications, past family history, past medical history, past social history, past surgical history and problem list.  Review of Systems Pertinent items are noted in HPI.   Objective:  BP 125/86   Pulse 85   Ht 5' 0.5" (1.537 m)   Wt 241 lb (109.3 kg)   LMP  (LMP Unknown)   Breastfeeding No   BMI 46.29 kg/m  Gen: well appearing, NAD HEENT: no scleral icterus CV: RR Lung: Normal WOB Abd: Obese Ext: warm well perfused  Assessment and Plan:   1. History of recurrent miscarriages (2019, 2020 and 2022) Provided support today -- patient appropriate tearful Discussed labwork to help prevent SABs Reviewed use of progesterone to help once she had positive pregnancy test - TSH - Hemoglobin A1c - US PELVIS (TRANSABDOMINAL ONLY); Future-- to look for structural abnormalities - progesterone (PROMETRIUM) 200 MG capsule; Take 1 capsule (200 mg total) by mouth daily. Start after positive pregnancy test  Dispense: 30 capsule; Refill: 4   Please refer to After Visit Summary for other counseling recommendations.   Return in about 3 months (around 12/08/2021) for follow up SAB work up and pregnancy.  Future Appointments  Date  Time Provider Department Center  09/18/2021  1:00 PM [facility] US1 [facility]     Federico Flake, MD, MPH, ABFM Attending Physician Faculty Practice- Center for [facility]

## 2021-09-08 LAB — TSH: TSH: 2.65 u[IU]/mL (ref 0.450–4.500)

## 2021-09-08 LAB — HEMOGLOBIN A1C
Est. average glucose Bld gHb Est-mCnc: 134 mg/dL
Hgb A1c MFr Bld: 6.3 % — ABNORMAL HIGH (ref 4.8–5.6)

## 2021-09-11 ENCOUNTER — Telehealth: Payer: Self-pay

## 2021-09-11 ENCOUNTER — Encounter: Payer: Self-pay | Admitting: Family Medicine

## 2021-09-11 DIAGNOSIS — R7303 Prediabetes: Secondary | ICD-10-CM | POA: Insufficient documentation

## 2021-09-11 NOTE — Telephone Encounter (Signed)
Call placed to pt with interpreter Raquel. Pt did not answer. No message left on VM. Will try to call pt tomorrow.  Judeth Cornfield, RN

## 2021-09-11 NOTE — Telephone Encounter (Signed)
-----   Message from Federico Flake, MD sent at 09/11/2021  4:18 PM EDT ----- TSH was negative HA1C shows prediabetes-- recommend starting metformin to help increase likelihood of pregnancy.  If patient desires to start, please confirm pharmacy and I will send in medication.

## 2021-09-12 MED ORDER — METFORMIN HCL 500 MG PO TABS: 500.0000 mg | ORAL_TABLET | Freq: Two times a day (BID) | ORAL | 12 refills | Status: AC

## 2021-09-12 NOTE — Telephone Encounter (Signed)
Sent in metformin 500 BID to help with insulin resistance.

## 2021-09-12 NOTE — Addendum Note (Signed)
Addended by: Geanie Berlin on: 09/12/2021 04:48 PM   Modules accepted: Orders

## 2021-09-12 NOTE — Telephone Encounter (Signed)
Call placed to pt with interpreter Raquel. Spoke with pt. Pt given results and recommendations per Dr Alvester Morin. Will notify Dr Alvester Morin to send in Rx today. Pt verbalized understanding and agreeable to plan of care.   Judeth Cornfield, RN

## 2021-09-18 ENCOUNTER — Other Ambulatory Visit: Payer: Self-pay

## 2021-09-18 ENCOUNTER — Ambulatory Visit
Admission: RE | Admit: 2021-09-18 | Discharge: 2021-09-18 | Disposition: A | Discharge status: Home / Self Care | Payer: Self-pay | Source: Ambulatory Visit | Attending: Family Medicine | Admitting: Family Medicine

## 2021-09-18 DIAGNOSIS — N96 Recurrent pregnancy loss: Secondary | ICD-10-CM | POA: Insufficient documentation

## 2021-09-19 ENCOUNTER — Telehealth: Payer: Self-pay | Admitting: *Deleted

## 2021-09-19 ENCOUNTER — Encounter: Payer: Self-pay | Admitting: Family Medicine

## 2021-09-19 DIAGNOSIS — N96 Recurrent pregnancy loss: Secondary | ICD-10-CM

## 2021-09-19 HISTORY — DX: Recurrent pregnancy loss: N96

## 2021-09-19 NOTE — Telephone Encounter (Addendum)
-----   Message from Federico Flake, MD sent at 09/19/2021 10:30 AM EST ----- Ultrasound shows some small fibroids that are unlikely the cause of her miscarriages but if she would like to discuss removal I can forward her chart to one of the GYN surgeons to review and she can have a telehealth visit to discuss this process. Removal of the fibroids may or may not increase her likelihood of pregnancy and would be a surgery.  11/8 1445  Called pt with interpreter Lorinda Creed. The call would not go through (heard fast busy signal). Unable to leave message and pt does not have additional contact number. Staff will continue to attempt to contact pt at another time.

## 2021-09-21 NOTE — Telephone Encounter (Signed)
Pt calling to say has not seen mychart message. Spoke with pt with interpreter Raquel. Pt given results and recommendations per Dr Alvester Morin.

## 2021-10-19 ENCOUNTER — Telehealth (INDEPENDENT_AMBULATORY_CARE_PROVIDER_SITE_OTHER): Payer: Self-pay | Admitting: Family Medicine

## 2021-10-19 ENCOUNTER — Encounter: Payer: Self-pay | Admitting: Family Medicine

## 2021-10-19 DIAGNOSIS — N96 Recurrent pregnancy loss: Secondary | ICD-10-CM

## 2021-10-19 DIAGNOSIS — R7303 Prediabetes: Secondary | ICD-10-CM

## 2021-10-19 NOTE — Progress Notes (Signed)
   TELEHEALTH GYNECOLOGY VISIT ENCOUNTER NOTE  Provider location: [facility] for [employer] [facility] [employer]   Patient location: car, passenger  I connected with Kaitlyn Brooks on 10/19/21 at  3:35 PM EST by telephone and verified that I am speaking with the correct person using two identifiers. Patient was unable to do MyChart audiovisual encounter due to technical difficulties, she tried several times.    I discussed the limitations, risks, security and privacy concerns of performing an evaluation and management service by telephone and the availability of in person appointments. I also discussed with the patient that there may be a patient responsible charge related to this service. The patient expressed understanding and agreed to proceed.   History:  Kaitlyn Brooks is a 40 y.o. G92P0030 female being evaluated today for referral to help fertility. She denies any abnormal vaginal discharge, bleeding, pelvic pain or other concerns.       Past Medical History:  Diagnosis Date   Cholecystitis    Cholecystitis 07/05/2017   Past Surgical History:  Procedure Laterality Date   CHOLECYSTECTOMY N/A 07/05/2017   Procedure: LAPAROSCOPIC CHOLECYSTECTOMY;  Surgeon: Emelia Loron, MD;  Location: [facility] OR;  Service: General;  Laterality: N/A;   UMBILICAL HERNIA REPAIR N/A 07/05/2017   Procedure: UMBILICAL HERNIA REPAIR;  Surgeon: Emelia Loron, MD;  Location: [facility] OR;  Service: General;  Laterality: N/A;   The following portions of the patient's history were reviewed and updated as appropriate: allergies, current medications, past family history, past medical history, past social history, past surgical history and problem list.    Review of Systems:  Pertinent items noted in HPI and remainder of comprehensive ROS otherwise negative.  Physical Exam:   General:  Alert, oriented and cooperative.   Mental Status: Normal mood and affect perceived. Normal judgment and thought  content.  Physical exam deferred due to nature of the encounter  Labs and Imaging No results found for this or any previous visit (from the past 336 hour(s)). No results found.    Assessment and Plan:     1. History of recurrent miscarriages SABx3 US showed fibroids- 1.3 x 0.9 x 1.3 cm submucosal fibroid seen at the mid posterior uterine body. Additional 3.0 x 1.7 x 2.5 cm intramural to fibroid present at the left uterine fundus. NO mullerian abnormality  Has RX for progesterone to start with positive pregnancy test Discussed I will forward her chart to one of our surgeons to review and possibly schedule appt to discuss removal.  Recommended she continue to try for pregnancy in this time and start metformin to help as well as progesterone when she has a positive pregnancy test.   2. Prediabetes Has not started metformin Encouraged to help with fertility  3. Morbid obesity (HCC) Encourage weight loss to improve pregnancy outcomes       I discussed the assessment and treatment plan with the patient. The patient was provided an opportunity to ask questions and all were answered. The patient agreed with the plan and demonstrated an understanding of the instructions.   The patient was advised to call back or seek an in-person evaluation/go to the ED if the symptoms worsen or if the condition fails to improve as anticipated.  I provided 15 minutes of non-face-to-face time during this encounter.   Federico Flake, MD Center for [employer], [facility]

## 2021-11-10 ENCOUNTER — Ambulatory Visit: Payer: Self-pay | Admitting: Family Medicine

## 2022-06-03 IMAGING — US US PELVIS COMPLETE
1 series · 14 of 25 positions shown · non-contrast
Comparison: Prior ultrasound from 06/02/2018.

CLINICAL DATA: Initial evaluation for recurrent miscarriages.

EXAM:
TRANSABDOMINAL ULTRASOUND OF PELVIS
TECHNIQUE: Transabdominal ultrasound examination of the pelvis was performed
including evaluation of the uterus, ovaries, adnexal regions, and
pelvic cul-de-sac.

[Series 1: us pelvis complete · 86 acquisitions, 14 frames shown]
[im 1/86]
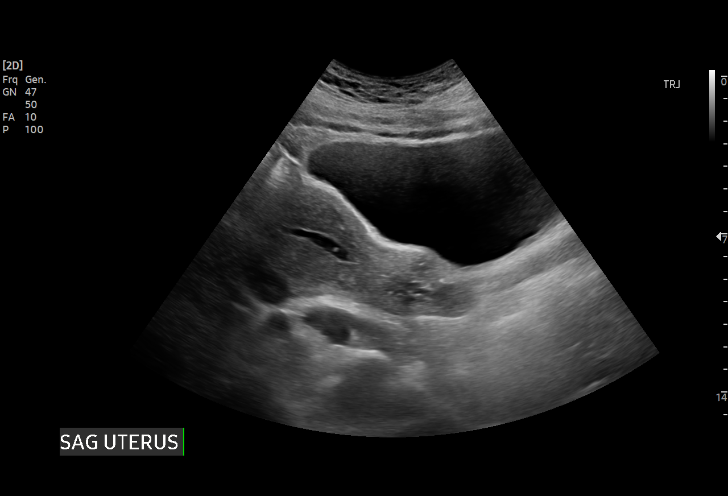
[im 8/86]
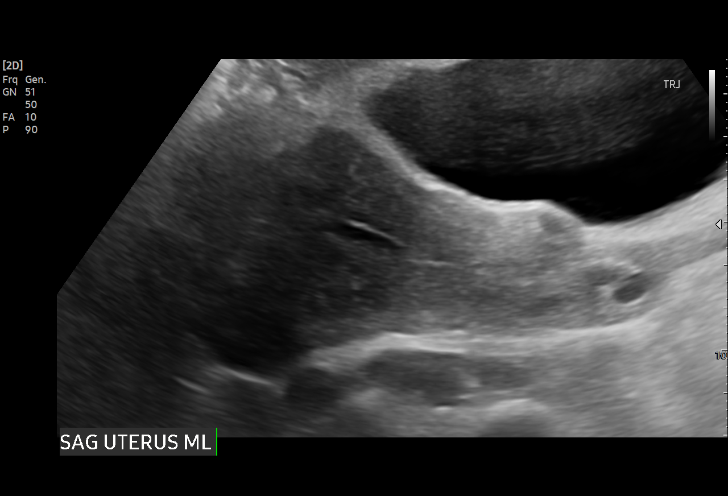
[im 15/86]
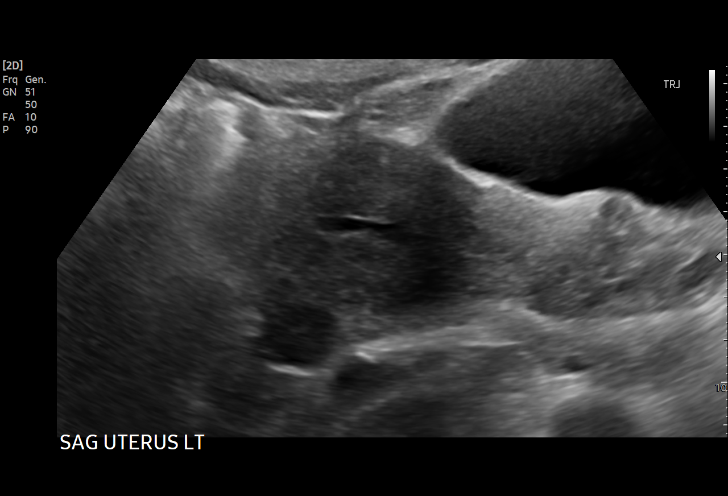
[im 22/86]
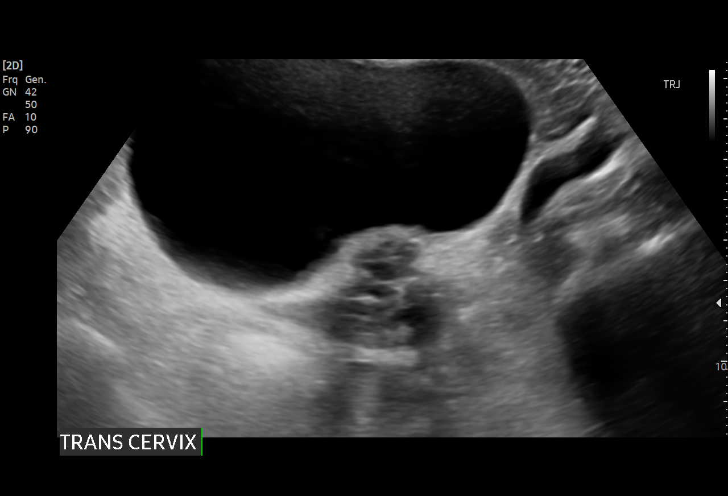
[im 29/86]
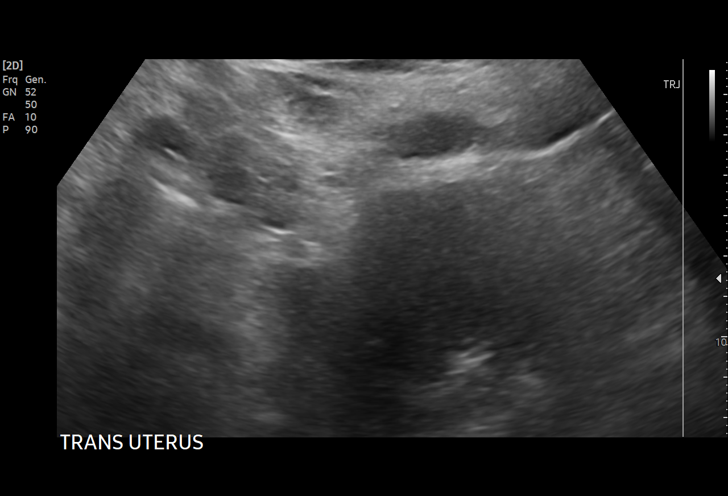
[im 32/86]
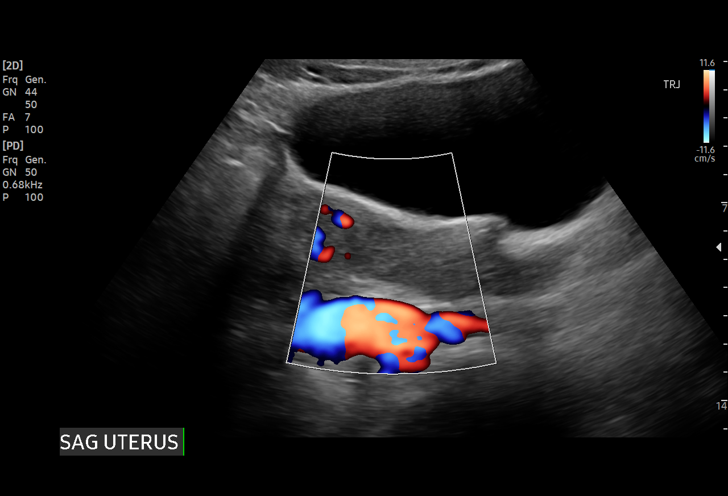
[im 39/86]
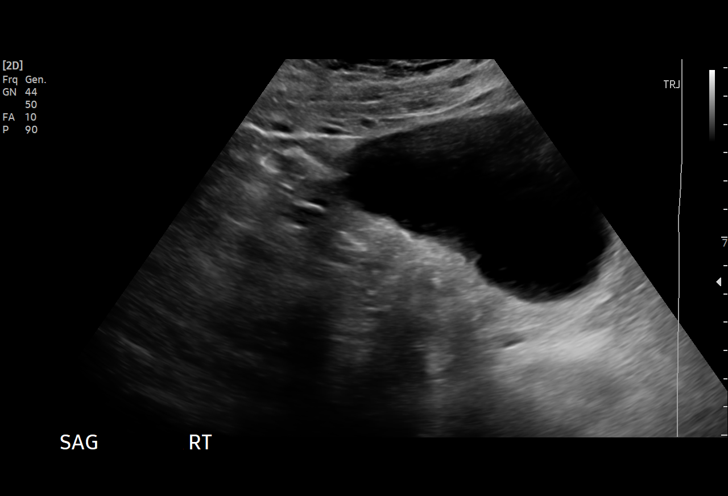
[im 47/86]
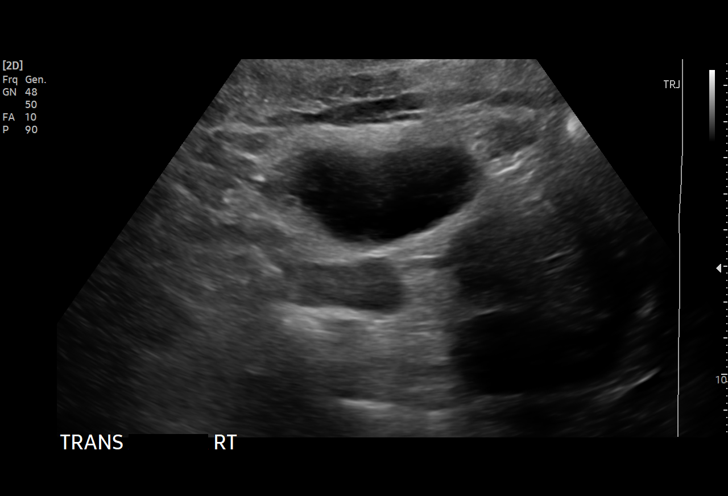
[im 54/86]
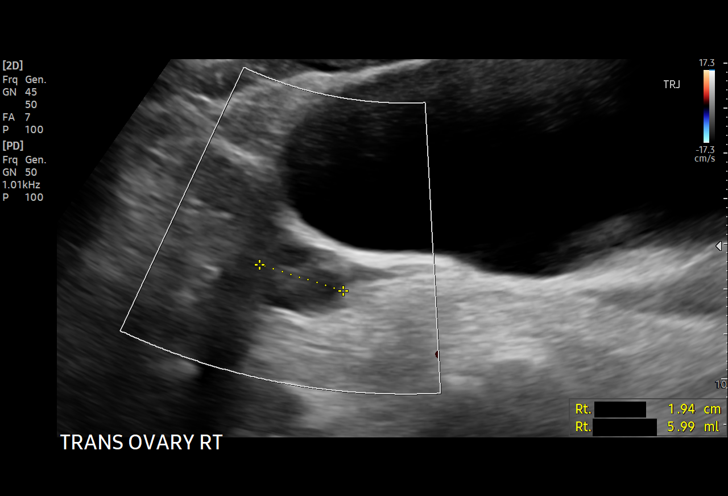
[im 57/86]
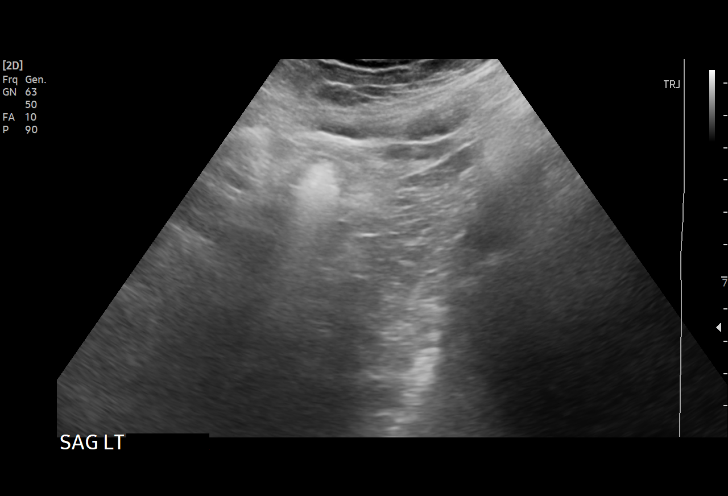
[im 64/86]
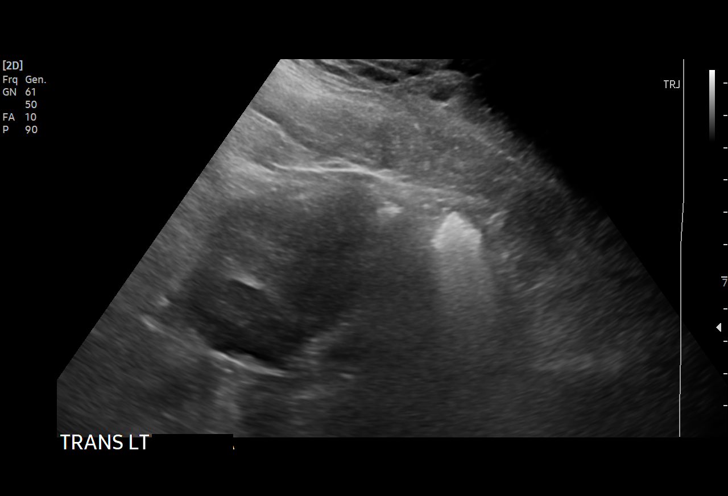
[im 71/86]
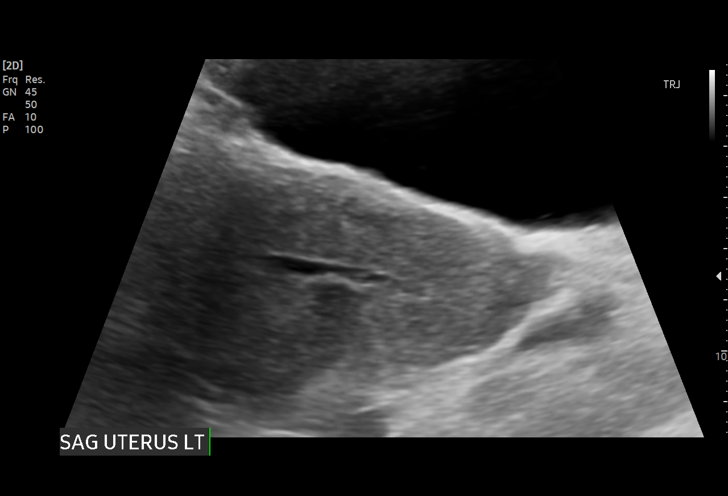
[im 78/86]
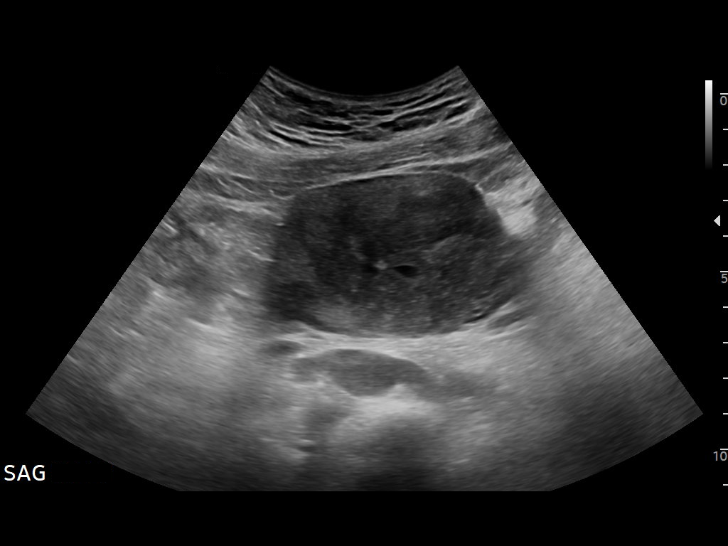
[im 86/86]
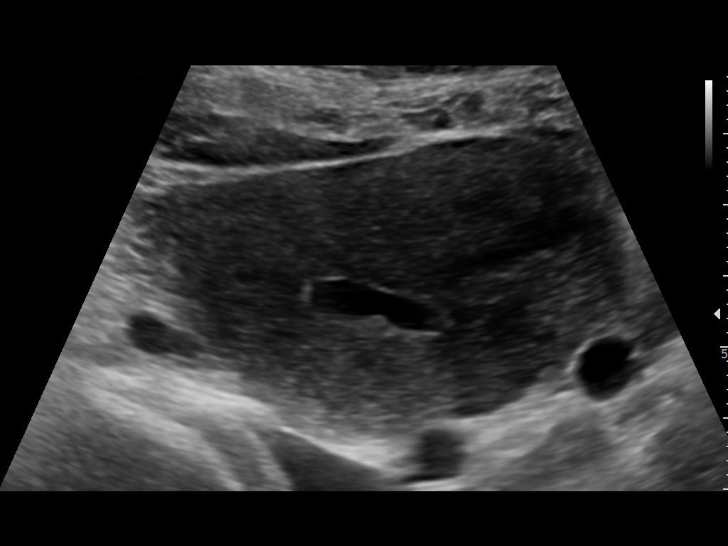

[14 of 25 positions shown; findings below may reference images not displayed]

FINDINGS: Uterus

Measurements: 8.8 x 3.9 x 6.6 cm = volume: 119.8 mL. Uterus is
anteverted. No visible Mullerian abnormality. 1.3 x 0.9 x 1.3 cm
submucosal fibroid seen at the mid posterior uterine body (image
69). Additional 3.0 x 1.7 x 2.5 cm intramural to fibroid present at
the left uterine fundus.

Endometrium

Thickness: 3.3 mm. Small amount of simple fluid present within the
endometrial cavity. Few small echogenic foci within the endometrial
cavity seen on a few images suspected to reflect foci of gas and/or
artifact. No other focal abnormality or abnormal vascularity.

Right ovary

Measurements: 3.6 x 1.6 x 1.9 cm = volume: 6.0 mL. Normal
appearance/no adnexal mass.

Left ovary

Measurements: 4.2 x 2.3 x 2.8 cm = volume: 14.2 mL. Normal
appearance/no adnexal mass.

Other findings

No abnormal free fluid.
IMPRESSION: 1. Fibroid uterus as detailed above, one of which measuring 1.3 cm
is submucosal in location.
2. Small amount of simple fluid within the endometrial cavity,
nonspecific. Underlying endometrial stripe is thin measuring 3.3 mm.
3. Normal sonographic appearance of the ovaries. No adnexal mass or
free fluid.

## 2023-05-15 ENCOUNTER — Other Ambulatory Visit: Payer: Self-pay | Admitting: Family Medicine

## 2023-05-15 ENCOUNTER — Ambulatory Visit (INDEPENDENT_AMBULATORY_CARE_PROVIDER_SITE_OTHER): Payer: Self-pay

## 2023-05-15 DIAGNOSIS — Z3201 Encounter for pregnancy test, result positive: Secondary | ICD-10-CM

## 2023-05-15 DIAGNOSIS — N96 Recurrent pregnancy loss: Secondary | ICD-10-CM

## 2023-05-15 LAB — POCT PREGNANCY, URINE: Preg Test, Ur: POSITIVE — AB

## 2023-05-15 NOTE — Progress Notes (Signed)
Pt here today for walk in pregnancy UPT resulting positive.  Attempted to contact with Spanish Interpreter Eda R. X 2.  Left message to return call in regards to results.   Leonette Nutting  05/15/23

## 2023-06-11 ENCOUNTER — Telehealth (INDEPENDENT_AMBULATORY_CARE_PROVIDER_SITE_OTHER): Payer: Self-pay

## 2023-06-11 DIAGNOSIS — O09521 Supervision of elderly multigravida, first trimester: Secondary | ICD-10-CM

## 2023-06-11 DIAGNOSIS — O0991 Supervision of high risk pregnancy, unspecified, first trimester: Secondary | ICD-10-CM

## 2023-06-11 DIAGNOSIS — O0990 Supervision of high risk pregnancy, unspecified, unspecified trimester: Secondary | ICD-10-CM | POA: Insufficient documentation

## 2023-06-11 DIAGNOSIS — O09529 Supervision of elderly multigravida, unspecified trimester: Secondary | ICD-10-CM | POA: Insufficient documentation

## 2023-06-11 DIAGNOSIS — Z3A10 10 weeks gestation of pregnancy: Secondary | ICD-10-CM

## 2023-06-11 NOTE — Progress Notes (Signed)
New OB Intake  I connected with Kaitlyn Brooks  on 06/11/23 at  3:15 PM EDT by MyChart Video Visit and verified that I am speaking with the correct person using two identifiers. Nurse is located at [facility] and pt is located at home. [employer] interpreter Geanie Logan ID #[id number] present virtually for encounter.  I discussed the limitations, risks, security and privacy concerns of performing an evaluation and management service by telephone and the availability of in person appointments. I also discussed with the patient that there may be a patient responsible charge related to this service. The patient expressed understanding and agreed to proceed.  I explained I am completing New OB Intake today. Reports first positive home UPT on 05/04/23. We discussed of 01/07/24 by sure LMP. Pt is [id number]. I reviewed her allergies, medications and Medical/Surgical/OB history.   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 Was also given metformin prescription which she took until November 2023 when refills were complete.  Delivery Plans Plans to deliver at [facility]. Discussed the nature of our practice with multiple providers including residents and students. Due to the size of the practice, the delivering provider may not be the same as those providing prenatal care.   Patient is not a water birth candidate.  MyChart/Babyscripts MyChart access verified. I explained pt will have some visits in office and  some virtually. Babyscripts instructions given and order placed.  Blood Pressure Cuff Patient is self-pay; explained patient will be given BP cuff at first prenatal appt. Explained after first prenatal appt pt will check weekly and document in Babyscripts.  Anatomy US Explained first scheduled Korea will be around 19 weeks. Prefers appts in the morning around 8:30 AM.  Is patient a CenteringPregnancy candidate?  Not a Candidate Not a candidate due to Language barrier   Is patient a Mom+Baby Combined Care candidate?  Not a candidate    First visit review I reviewed new OB appt with patient. Explained pt will be seen by [name], DO at first visit. Reviewed routine labs will be drawn including A1C (patient concerned she may need to restart Metformin). Offered genetic screening; patient desires both [facility] and [address].  Last Pap Patient cannot recall. Consider completing PP with [facility] due to cost.  [name], RN 06/11/2023  4:02 PM

## 2023-06-13 ENCOUNTER — Ambulatory Visit (INDEPENDENT_AMBULATORY_CARE_PROVIDER_SITE_OTHER): Payer: Self-pay

## 2023-06-13 ENCOUNTER — Ambulatory Visit (INDEPENDENT_AMBULATORY_CARE_PROVIDER_SITE_OTHER): Payer: Self-pay | Admitting: Family Medicine

## 2023-06-13 ENCOUNTER — Encounter: Payer: Self-pay | Admitting: Family Medicine

## 2023-06-13 VITALS — BP 126/86 | HR 89 | Wt 242.1 lb

## 2023-06-13 DIAGNOSIS — O021 Missed abortion: Secondary | ICD-10-CM

## 2023-06-13 DIAGNOSIS — Z3A01 Less than 8 weeks gestation of pregnancy: Secondary | ICD-10-CM

## 2023-06-13 DIAGNOSIS — Z3A10 10 weeks gestation of pregnancy: Secondary | ICD-10-CM

## 2023-06-13 DIAGNOSIS — O0990 Supervision of high risk pregnancy, unspecified, unspecified trimester: Secondary | ICD-10-CM

## 2023-06-13 DIAGNOSIS — Z98891 History of uterine scar from previous surgery: Secondary | ICD-10-CM

## 2023-06-13 DIAGNOSIS — O09521 Supervision of elderly multigravida, first trimester: Secondary | ICD-10-CM

## 2023-06-13 DIAGNOSIS — O0991 Supervision of high risk pregnancy, unspecified, first trimester: Secondary | ICD-10-CM

## 2023-06-13 NOTE — Progress Notes (Signed)
The entire encounter was performed in the presence of a Spanish interpreter  History:   Kaitlyn Brooks is a 42 y.o. [id number] at [redacted]w[redacted]d by LMP being seen today for her first obstetrical visit.  Her obstetrical history is significant for advanced maternal age, obesity, and hx of SAB X3 .  Pregnancy history fully reviewed. She is not currently taking metformin or Prometrium. She has not had any prior ultrasounds.   Patient reports no complaints.      HISTORY: OB History  Gravida Para Term Preterm AB Living  6 2 2  0 3 2  SAB IAB Ectopic Multiple Live Births  3 0 0 0 2    # Outcome Date GA Lbr Len/2nd Weight Sex Type Anes PTL Lv  6 Current           5 SAB 2021          4 SAB 2020          3 SAB 2019          2 Term 08/19/09   3.629 kg F CS-LTranv   LIV  1 Term 10/24/02   3.629 kg M Vag-Spont   LIV    Patient cannot recall. Consider completing PP with B[facility]CP due to cost.   Past Medical History:  Diagnosis Date   Cholecystitis    Cholecystitis 07/05/2017   Past Surgical History:  Procedure Laterality Date   CHOLECYSTECTOMY N/A 07/05/2017   Procedure: LAPAROSCOPIC CHOLECYSTECTOMY;  Surgeon: Emelia Loron, MD;  Location: [facility] OR;  Service: General;  Laterality: N/A;   UMBILICAL HERNIA REPAIR N/A 07/05/2017   Procedure: UMBILICAL HERNIA REPAIR;  Surgeon: Emelia Loron, MD;  Location: [facility] OR;  Service: General;  Laterality: N/A;   Family History  Problem Relation Age of Onset   Kidney disease Mother    Diabetes Sister    Diabetes Maternal Grandmother    Social History   Tobacco Use   Smoking status: Never   Smokeless tobacco: Never  Vaping Use   Vaping status: Never Used  Substance Use Topics   Alcohol use: No   Drug use: No   No Known Allergies Current Outpatient Medications on File Prior to Visit  Medication Sig Dispense Refill   Prenatal Vit-Fe Fumarate-FA (MULTIVITAMIN-PRENATAL) 27-0.8 MG TABS tablet Take 1 tablet by mouth daily at 12 noon.     acetaminophen  (TYLENOL) 325 MG tablet Take 2 tablets (650 mg total) by mouth every 6 (six) hours as needed. (Patient not taking: Reported on 06/11/2023) 30 tablet 0   metFORMIN (GLUCOPHAGE) 500 MG tablet Take 1 tablet (500 mg total) by mouth 2 (two) times daily with a meal. (Patient not taking: Reported on 06/11/2023) 60 tablet 12   progesterone (PROMETRIUM) 200 MG capsule Take 1 capsule (200 mg total) by mouth daily. Start after positive pregnancy test (Patient not taking: Reported on 10/19/2021) 30 capsule 4   No current facility-administered medications on file prior to visit.    Review of Systems Pertinent items noted in HPI and remainder of comprehensive ROS otherwise negative. Physical Exam:   Vitals:   06/13/23 1432  BP: 126/86  Pulse: 89  Weight: 109.8 kg     Bedside Ultrasound for FHR check: Unable to confirmed viability on BSUS.   Patient informed that the ultrasound is considered a limited obstetric ultrasound and is not intended to be a complete ultrasound exam.  Patient also informed that the ultrasound is not being completed with the intent of assessing for fetal or placental  anomalies or any pelvic abnormalities.  Explained that the purpose of today's ultrasound is to assess for fetal heart rate.  Patient acknowledges the purpose of the exam and the limitations of the study.  Constitutional: Well-developed, well-nourished pregnant female in no acute distress.  HEENT: EOMI. Normal sclera. Skin: normal color and turgor, no rash Cardiovascular: normal rate & rhythm, no murmur Respiratory: normal effort, lung sounds clear throughout GI: Abd soft, non-tender MS: Extremities nontender, no edema, normal ROM Neurologic: Alert and oriented x 4.   ----------------------------------------------------------------------  OBSTETRICS REPORT                        (Signed Final 06/14/2023 11:02 am) ---------------------------------------------------------------------- Patient Info    ID #:        [id number]                          D.O.B.:  [birth year] (41 yrs)  Name:       Kaitlyn Brooks               Visit Date: 06/13/2023 03:55 pm ---------------------------------------------------------------------- Performed By    Attending:        Jaynie Collins         Ref. Address:      [address]                    MD                                                              [place], [place]                                                              [address]  Performed By:     Otilio Jefferson BA       Location:          [facility] for                    [facility]                                  Women's                                                              Healthcare at                                                              [facility]  Referred By:      [facility]          Visit Type:        OB                    for Women ---------------------------------------------------------------------- Orders    #  Description                           Code        Ordered By  1  US OB LESS THAN 14 WEEKS              [id number]     Tinnie Gens     WITH OB TRANSVAGINAL ----------------------------------------------------------------------    #  Order #                     Accession #                Episode #  1  [id number]                   [id number]                 [id number] ---------------------------------------------------------------------- Indications    Less than [redacted] weeks gestation of pregnancy        Z3A.01  Pregnancy with inconclusive fetal viability     O36.80X0 ---------------------------------------------------------------------- Fetal Evaluation    Num Of Fetuses:          1  Preg. Location:          Intrauterine  Gest. Sac:               Intrauterine  Yolk Sac:                Not visualized  Fetal Pole:              Visualized  Fetal Heart Rate(bpm):   0  Cardiac Activity:         Absent ---------------------------------------------------------------------- Biometry    CRL:        15  mm     G. Age:  7w 6d                   EDD:    01/24/24 ---------------------------------------------------------------------- Gestational Age    Best:          7w 6d      Det. By:  U/S C R L (06/13/23)     EDD:   01/24/24 ---------------------------------------------------------------------- Impression    Intrauterine fetal demise at [redacted]w[redacted]d. ---------------------------------------------------------------------- Recommendations  Patient will be informed of findings and counseled about  management options. ----------------------------------------------------------------------              Jaynie Collins, MD Electronically Signed Final Report   06/14/2023 11:02 am    Assessment:    1. Missed abortion The patient was informed of results during the visit. She did not want to discuss options at this time. This is difficult for her being her 4th missed SAB in a row. She does have 2 prior children, being her first two pregnancies. We discussed calling Monday for follow up. Beyond missed SAB management, I would consider genetic/fertility referral given her age and history.  - US OB LESS THAN 14 WEEKS WITH OB TRANSVAGINAL  2. [redacted] weeks gestation of pregnancy She is 10 weeks by exact LMP. Infection return precautions discussed. She voiced understanding.  [name], DO OB Fellow, Faculty [facility], Center for [facility] 06/15/2023, 2:08 AM

## 2023-06-19 ENCOUNTER — Telehealth: Payer: Self-pay

## 2023-06-19 NOTE — Telephone Encounter (Addendum)
-----   Message from [name] sent at 06/19/2023 11:34 AM EDT ----- Regarding: RE: Missed AB follow up Yes please have someone reach out to her to see if she is ready to discuss her options. Thank you.   [name], DO OB Fellow, Faculty [facility], Center for [facility] 06/19/2023, 11:35 AM ----- Message ----- From: [name], RN Sent: 06/18/2023  12:36 PM EDT To: [name], DO Subject: Missed AB follow up                            I just saw this patient had a missed AB. We haven't heard from her this week. Would you like someone to reach out to her to follow up or were you planning to call to go over options?    ----------------------------------  Called pt with [employer] interpreter ID [phone] x 2; VM left. Need to attempt to call patient a second time.

## 2023-06-20 NOTE — Telephone Encounter (Signed)
Called patient with assistance of In [employer], [place]. Patient did not answer. LM for patient to call the office at her earliest convenience. Tried a second time and she did not answer.

## 2023-06-20 NOTE — Telephone Encounter (Signed)
Called patient with assistance of In person interpreter, Myra. She did not answer. LM for patient to call the office at her first convenience.

## 2023-07-07 ENCOUNTER — Encounter (HOSPITAL_COMMUNITY): Payer: Self-pay | Admitting: Obstetrics & Gynecology

## 2023-07-07 ENCOUNTER — Inpatient Hospital Stay (HOSPITAL_COMMUNITY): Payer: Self-pay

## 2023-07-07 ENCOUNTER — Inpatient Hospital Stay (HOSPITAL_COMMUNITY)
Admission: AD | Admit: 2023-07-07 | Discharge: 2023-07-08 | Disposition: A | Discharge status: Home / Self Care | Payer: Self-pay | Attending: Obstetrics & Gynecology | Admitting: Obstetrics & Gynecology

## 2023-07-07 DIAGNOSIS — Z3A01 Less than 8 weeks gestation of pregnancy: Secondary | ICD-10-CM

## 2023-07-07 DIAGNOSIS — O021 Missed abortion: Secondary | ICD-10-CM | POA: Insufficient documentation

## 2023-07-07 DIAGNOSIS — N939 Abnormal uterine and vaginal bleeding, unspecified: Secondary | ICD-10-CM

## 2023-07-07 LAB — URINALYSIS, ROUTINE W REFLEX MICROSCOPIC
Bacteria, UA: NONE SEEN
Bilirubin Urine: NEGATIVE
Glucose, UA: NEGATIVE mg/dL
Ketones, ur: NEGATIVE mg/dL
Leukocytes,Ua: NEGATIVE
Nitrite: NEGATIVE
Protein, ur: NEGATIVE mg/dL
Specific Gravity, Urine: 1.008 (ref 1.005–1.030)
pH: 6 (ref 5.0–8.0)

## 2023-07-07 NOTE — MAU Provider Note (Signed)
History     CSN: [id number]  Arrival date and time: 07/07/23 2051   Event Date/Time   First Provider Initiated Contact with Patient 07/07/23 2229      No chief complaint on file.  Kaitlyn Brooks , a  42 y.o. [id number] at [redacted]w[redacted]d presents to MAU with complaints of brown spotting with wiping that started last Friday. Patient states that it is only when she wipes and denies passing clots or tissue. She denies having to wear a pad and denies  any pain associated with it.   Patient recently had a Korea on 8/1 that revealed a missed AB. Patient denies knowing anything about it.          OB History     Gravida  6   Para  2   Term  2   Preterm  0   AB  3   Living  2      SAB  3   IAB  0   Ectopic  0   Multiple  0   Live Births  2           Past Medical History:  Diagnosis Date   Cholecystitis    Cholecystitis 07/05/2017    Past Surgical History:  Procedure Laterality Date   CESAREAN SECTION     CHOLECYSTECTOMY N/A 07/05/2017   Procedure: LAPAROSCOPIC CHOLECYSTECTOMY;  Surgeon: Emelia Loron, MD;  Location: [facility] OR;  Service: General;  Laterality: N/A;   UMBILICAL HERNIA REPAIR N/A 07/05/2017   Procedure: UMBILICAL HERNIA REPAIR;  Surgeon: Emelia Loron, MD;  Location: [facility] OR;  Service: General;  Laterality: N/A;    Family History  Problem Relation Age of Onset   Kidney disease Mother    Diabetes Sister    Diabetes Maternal Grandmother     Social History   Tobacco Use   Smoking status: Never   Smokeless tobacco: Never  Vaping Use   Vaping status: Never Used  Substance Use Topics   Alcohol use: No   Drug use: No    Allergies: No Known Allergies  Medications Prior to Admission  Medication Sig Dispense Refill Last Dose   Prenatal Vit-Fe Fumarate-FA (MULTIVITAMIN-PRENATAL) 27-0.8 MG TABS tablet Take 1 tablet by mouth daily at 12 noon.   07/07/2023   acetaminophen (TYLENOL) 325 MG tablet Take 2 tablets (650 mg total) by mouth every 6  (six) hours as needed. (Patient not taking: Reported on 06/11/2023) 30 tablet 0    metFORMIN (GLUCOPHAGE) 500 MG tablet Take 1 tablet (500 mg total) by mouth 2 (two) times daily with a meal. (Patient not taking: Reported on 06/11/2023) 60 tablet 12    progesterone (PROMETRIUM) 200 MG capsule Take 1 capsule (200 mg total) by mouth daily. Start after positive pregnancy test (Patient not taking: Reported on 10/19/2021) 30 capsule 4     Review of Systems  Constitutional:  Negative for chills, fatigue and fever.  Eyes:  Negative for pain and visual disturbance.  Respiratory:  Negative for apnea, shortness of breath and wheezing.   Cardiovascular:  Negative for chest pain and palpitations.  Gastrointestinal:  Negative for abdominal pain, constipation, diarrhea, nausea and vomiting.  Genitourinary:  Positive for vaginal bleeding and vaginal discharge. Negative for difficulty urinating, dysuria, pelvic pain and vaginal pain.  Musculoskeletal:  Negative for back pain.  Neurological:  Negative for seizures, weakness and headaches.  Psychiatric/Behavioral:  Negative for suicidal ideas.    Physical Exam   Blood pressure 126/71, pulse 91, temperature 98.2 F (  36.8 C), resp. rate 18, height 5\' 1"  (1.549 m), weight 109 kg, last menstrual period 04/02/2023.  Physical Exam Vitals and nursing note reviewed.  Constitutional:      General: She is not in acute distress.    Appearance: Normal appearance.  HENT:     Head: Normocephalic.  Pulmonary:     Effort: Pulmonary effort is normal.  Abdominal:     Palpations: Abdomen is soft.     Tenderness: There is no abdominal tenderness.  Musculoskeletal:     Cervical back: Normal range of motion.  Skin:    General: Skin is warm and dry.  Neurological:     Mental Status: She is alert and oriented to person, place, and time.  Psychiatric:        Mood and Affect: Mood normal.     MAU Course  Procedures Orders Placed This Encounter  Procedures   US OB  LESS THAN 14 WEEKS WITH OB TRANSVAGINAL   Urinalysis, Routine w reflex microscopic -Urine, Clean Catch   Results for orders placed or performed during the hospital encounter of 07/07/23 (from the past 24 hour(s))  Urinalysis, Routine w reflex microscopic -Urine, Clean Catch     Status: Abnormal   Collection Time: 07/07/23  9:59 PM  Result Value Ref Range   Color, Urine YELLOW YELLOW   APPearance HAZY (A) CLEAR   Specific Gravity, Urine 1.008 1.005 - 1.030   pH 6.0 5.0 - 8.0   Glucose, UA NEGATIVE NEGATIVE mg/dL   Hgb urine dipstick SMALL (A) NEGATIVE   Bilirubin Urine NEGATIVE NEGATIVE   Ketones, ur NEGATIVE NEGATIVE mg/dL   Protein, ur NEGATIVE NEGATIVE mg/dL   Nitrite NEGATIVE NEGATIVE   Leukocytes,Ua NEGATIVE NEGATIVE   RBC / HPF 0-5 0 - 5 RBC/hpf   WBC, UA 0-5 0 - 5 WBC/hpf   Bacteria, UA NONE SEEN NONE SEEN   Squamous Epithelial / HPF 0-5 0 - 5 /HPF   US OB Comp Less 14 Wks  Result Date: 07/07/2023 CLINICAL DATA:  History of missed AP with continued vaginal bleeding EXAM: OBSTETRIC <14 WK ULTRASOUND TECHNIQUE: Transabdominal ultrasound was performed for evaluation of the gestation as well as the maternal uterus and adnexal regions. COMPARISON:  By report with a study from 06/13/2023 FINDINGS: Intrauterine gestational sac: Present Yolk sac:  Absent Embryo:  Present Cardiac Activity: Absent Maternal uterus/adnexae: Left ovary is not well visualized. Right ovary is within normal limits. Multiple uterine fibroids are seen. Largest of these measures 3.6 cm posteriorly. IMPRESSION: Multiple uterine fibroids. Previously described intrauterine gestational sac with evidence of fetal demise is again identified similar to that seen on the prior exam. Electronically Signed   By: Alcide Clever M.D.   On: 07/07/2023 23:41    MDM - Repeat US today confirms Missed AB that was seen earlier on 8/1.  - Reviewed results and recommendation for Either PO Cytotec or D&C. Consulted Dr. Charlotta Newton on plan of care  given length of time that has passed and patient has not passed pregnancy as of yet. MD agrees with plan for Cytotec.  - plan for discharge.   Assessment and Plan   1. Missed ab   2. [redacted] weeks gestation of pregnancy   3. Vaginal spotting    - Discussed that vaginal spotting likely caused by the spontaneous onset of miscarriage  of missed AB.  - Condolences given and all questions answered at patient bedside.  - Reviewed results of Missed AB and the recommendation for cytotec. Patient  agreeable to plan of care. Admin instructions provided at patient bedside.  - Pain and bleeding expectations and precautions reviewed.  - Rx for cytotec, zofran, imodium and a short course of percocet sent to outpatient pharmacy to pick up.  - Message sent to office to get patient set up with follow up next week to confirm passage of products.  - Patient discharged home in stable condition and may return to MAU as needed.   Claudette Head, MSN CNM  07/07/2023, 10:29 PM

## 2023-07-08 ENCOUNTER — Telehealth: Payer: Self-pay | Admitting: General Practice

## 2023-07-08 DIAGNOSIS — N939 Abnormal uterine and vaginal bleeding, unspecified: Secondary | ICD-10-CM

## 2023-07-08 DIAGNOSIS — O021 Missed abortion: Secondary | ICD-10-CM

## 2023-07-08 DIAGNOSIS — Z3A01 Less than 8 weeks gestation of pregnancy: Secondary | ICD-10-CM

## 2023-07-08 MED ORDER — ONDANSETRON 4 MG PO TBDP: 4.0000 mg | ORAL_TABLET | Freq: Three times a day (TID) | ORAL | 0 refills | Status: AC | PRN

## 2023-07-08 MED ORDER — MISOPROSTOL 200 MCG PO TABS
ORAL_TABLET | ORAL | 0 refills | Status: AC
Start: 2023-07-08 — End: ?

## 2023-07-08 MED ORDER — MISOPROSTOL 200 MCG PO TABS: ORAL_TABLET | ORAL | 1 refills | Status: DC

## 2023-07-08 MED ORDER — OXYCODONE HCL 5 MG PO TABS
5.0000 mg | ORAL_TABLET | ORAL | 0 refills | Status: AC | PRN
Start: 2023-07-08 — End: ?

## 2023-07-08 MED ORDER — LOPERAMIDE HCL 2 MG PO TABS
2.0000 mg | ORAL_TABLET | Freq: Four times a day (QID) | ORAL | 0 refills | Status: AC | PRN
Start: 2023-07-08 — End: ?

## 2023-07-08 NOTE — Telephone Encounter (Signed)
Scheduled follow up appt for 9/5 with Dr Vergie Living and ultrasound beforehand to rule out retained products per Dorathy Daft CNM. Called patient with Debarah Crape assisting with spanish interpretation, no answer-left message to call back regarding appt next week. Will try again later

## 2023-07-11 NOTE — Telephone Encounter (Signed)
Called pt with Spanish interpreter Eda; appts reviewed.

## 2023-07-17 ENCOUNTER — Other Ambulatory Visit: Payer: Self-pay

## 2023-07-17 DIAGNOSIS — O034 Incomplete spontaneous abortion without complication: Secondary | ICD-10-CM

## 2023-07-18 ENCOUNTER — Ambulatory Visit (INDEPENDENT_AMBULATORY_CARE_PROVIDER_SITE_OTHER): Payer: Self-pay

## 2023-07-18 ENCOUNTER — Ambulatory Visit (INDEPENDENT_AMBULATORY_CARE_PROVIDER_SITE_OTHER): Payer: Self-pay | Admitting: Obstetrics and Gynecology

## 2023-07-18 ENCOUNTER — Encounter: Payer: Self-pay | Admitting: Obstetrics and Gynecology

## 2023-07-18 ENCOUNTER — Other Ambulatory Visit: Payer: Self-pay

## 2023-07-18 VITALS — BP 115/78 | HR 103 | Temp 99.0°F

## 2023-07-18 DIAGNOSIS — Z3A12 12 weeks gestation of pregnancy: Secondary | ICD-10-CM

## 2023-07-18 DIAGNOSIS — D219 Benign neoplasm of connective and other soft tissue, unspecified: Secondary | ICD-10-CM

## 2023-07-18 DIAGNOSIS — Z758 Other problems related to medical facilities and other health care: Secondary | ICD-10-CM

## 2023-07-18 DIAGNOSIS — Z3A15 15 weeks gestation of pregnancy: Secondary | ICD-10-CM

## 2023-07-18 DIAGNOSIS — O034 Incomplete spontaneous abortion without complication: Secondary | ICD-10-CM

## 2023-07-18 DIAGNOSIS — O09521 Supervision of elderly multigravida, first trimester: Secondary | ICD-10-CM

## 2023-07-18 DIAGNOSIS — O039 Complete or unspecified spontaneous abortion without complication: Secondary | ICD-10-CM

## 2023-07-19 ENCOUNTER — Encounter: Payer: Self-pay | Admitting: Obstetrics and Gynecology

## 2023-07-19 DIAGNOSIS — Z758 Other problems related to medical facilities and other health care: Secondary | ICD-10-CM | POA: Insufficient documentation

## 2023-07-19 NOTE — Progress Notes (Signed)
Obstetrics and Gynecology Visit Return Patient Evaluation  Appointment Date: 07/18/2023  Primary Care Provider: Patient, No Pcp Per  OBGYN Clinic: Center for [facility]  Chief Complaint: medical management for missed AB follow up   History of Present Illness:  Kaitlyn Brooks is a 42 y.o. with above chief complaint.  Patient diagnosed 7wk missed AB in early August and patient elected for expectant management. She then presented to MAU in late August with spotting and u/s confirmed still with POCs. Cytotec given, which she states she took and had bleeding shortly thereafter. Currently with just brown spotting. Pt here for u/s and follow up.   Review of Systems: as noted in the History of Present Illness.  Medications:  Aiesha Constantineau had no medications administered during this visit. Allergies: has No Known Allergies.  Physical Exam:  BP 115/78   Pulse (!) 103   Temp 99 F (37.2 C)   LMP 04/02/2023 (Exact Date)  There is no height or weight on file to calculate BMI. General appearance: Well nourished, well developed female in no acute distress.  Abdomen: soft, nttp Neuro/Psych:  Normal mood and affect.    Radiology Narrative & Impression    ----------------------------------------------------------------------  OBSTETRICS REPORT                       (Signed Final 07/19/2023 07:50 am) ---------------------------------------------------------------------- Patient Info    ID #:       [id number]                          D.O.B.:  09/14/1981 (42 yrs)  Name:       Kaitlyn Brooks               Visit Date: 07/18/2023 03:29 pm ---------------------------------------------------------------------- Performed By    Attending:        Stafford Bing MD     Ref. Address:     [address]                                                             [place], [place]                                                             [address]  Performed By:     Otilio Jefferson BA       Location:         Center for                    [facility] RDMS                                 Women's  Healthcare at                                                             [employer] for                                                             Women  Referred By:      [facility]          Visit Type:       OB                    for Women ---------------------------------------------------------------------- Orders    #  Description                           Code        Ordered By  1  US OB TRANSVAGINAL                    [id number]     Sandra Cockayne ----------------------------------------------------------------------    #  Order #                     Accession #                Episode #  1  [id number]                   [id number]                 [id number] ---------------------------------------------------------------------- Indications    [redacted] weeks gestation of pregnancy                Z3A.12  Pregnancy with inconclusive fetal viability    O36.80X0 ---------------------------------------------------------------------- Fetal Evaluation    Num Of Fetuses:         1  Preg. Location:         No sac seen  Gest. Sac:              None seen  Yolk Sac:               Not visualized  Fetal Pole:             Not visualized  Fetal Heart Rate(bpm):  0  Cardiac Activity:       No embryo visualized ---------------------------------------------------------------------- Gestational Age    Best:          12w 6d     Det. By:  U/S C R L  (06/13/23)    EDD:   01/24/24 ---------------------------------------------------------------------- Cervix Uterus Adnexa    Uterus  Endometrial lining 4.9mm ; anterior IM fundal  fibroid 2-3c and posterior IM fundal fibroid 3-4cm    Comment  no  doppler studies  done ---------------------------------------------------------------------- Comments    There is a small amount of fluid in the endometrial cavity. ---------------------------------------------------------------------- Impression    Cleared miscarriage ---------------------------------------------------------------------- Recommendations    No evidence of retained products of conception ----------------------------------------------------------------------                [place] Bing, MD Electronically Signed Final Report   07/19/2023 07:50 am   Assessment: patient doing well  Plan:  1. Fibroid I told her that these did not cause her missed AB and shouldn't affect her fertility. Missed AB most likely caused by AMA. Patient with periods that were qmonth and regular prior to missed AB. May want to try again.   2. Missed AB See above  3. GYN D/w her re: B[facility]CP referral for pap smear  In person interpreter used  RTC: PRN  Return if symptoms worsen or fail to improve.  No future appointments.  Cornelia Copa MD Attending Center for [employer] Mid[facility])

## 2023-09-05 ENCOUNTER — Telehealth: Payer: Self-pay

## 2023-09-05 NOTE — Telephone Encounter (Signed)
Telephoned patient using interpreter, Kaitlyn Brooks. Left voice message with B[facility]CP contact information.
